# Patient Record
Sex: Male | Born: 1935 | Race: Black or African American | Hispanic: No | Smoking: Former smoker
Health system: Southern US, Community
[De-identification: ages and names within clinical notes are randomized; demographics above are authoritative.]

## PROBLEM LIST (undated history)

## (undated) DIAGNOSIS — D126 Benign neoplasm of colon, unspecified: Secondary | ICD-10-CM

## (undated) DIAGNOSIS — C882 Heavy chain disease: Secondary | ICD-10-CM

## (undated) DIAGNOSIS — D649 Anemia, unspecified: Secondary | ICD-10-CM

## (undated) DIAGNOSIS — D696 Thrombocytopenia, unspecified: Secondary | ICD-10-CM

## (undated) DIAGNOSIS — N183 Chronic kidney disease, stage 3 unspecified: Secondary | ICD-10-CM

## (undated) DIAGNOSIS — R911 Solitary pulmonary nodule: Secondary | ICD-10-CM

## (undated) DIAGNOSIS — C189 Malignant neoplasm of colon, unspecified: Secondary | ICD-10-CM

## (undated) DIAGNOSIS — D472 Monoclonal gammopathy: Secondary | ICD-10-CM

## (undated) DIAGNOSIS — E119 Type 2 diabetes mellitus without complications: Secondary | ICD-10-CM

## (undated) DIAGNOSIS — K3184 Gastroparesis: Secondary | ICD-10-CM

## (undated) DIAGNOSIS — N189 Chronic kidney disease, unspecified: Secondary | ICD-10-CM

## (undated) DIAGNOSIS — I495 Sick sinus syndrome: Secondary | ICD-10-CM

## (undated) DIAGNOSIS — I2510 Atherosclerotic heart disease of native coronary artery without angina pectoris: Secondary | ICD-10-CM

## (undated) DIAGNOSIS — C61 Malignant neoplasm of prostate: Secondary | ICD-10-CM

## (undated) DIAGNOSIS — N289 Disorder of kidney and ureter, unspecified: Secondary | ICD-10-CM

## (undated) DIAGNOSIS — I10 Essential (primary) hypertension: Secondary | ICD-10-CM

## (undated) DIAGNOSIS — E785 Hyperlipidemia, unspecified: Secondary | ICD-10-CM

## (undated) HISTORY — DX: Disorder of kidney and ureter, unspecified: N28.9

## (undated) HISTORY — DX: Solitary pulmonary nodule: R91.1

## (undated) HISTORY — DX: Benign neoplasm of colon, unspecified: D12.6

## (undated) HISTORY — DX: Gastroparesis: K31.84

## (undated) HISTORY — DX: Type 2 diabetes mellitus without complications: E11.9

## (undated) HISTORY — DX: Chronic kidney disease, stage 3 (moderate): N18.3

## (undated) HISTORY — DX: Sick sinus syndrome: I49.5

## (undated) HISTORY — DX: Atherosclerotic heart disease of native coronary artery without angina pectoris: I25.10

## (undated) HISTORY — DX: Monoclonal gammopathy: D47.2

## (undated) HISTORY — DX: Hyperlipidemia, unspecified: E78.5

## (undated) HISTORY — DX: Essential (primary) hypertension: I10

## (undated) HISTORY — PX: PROSTATE BIOPSY: SHX241

## (undated) HISTORY — DX: Thrombocytopenia, unspecified: D69.6

## (undated) HISTORY — DX: Heavy chain disease: C88.2

## (undated) HISTORY — PX: ESOPHAGOGASTRODUODENOSCOPY: SHX1529

## (undated) HISTORY — DX: Malignant neoplasm of colon, unspecified: C18.9

## (undated) HISTORY — DX: Chronic kidney disease, stage 3 unspecified: N18.30

## (undated) HISTORY — DX: Anemia, unspecified: D64.9

## (undated) HISTORY — DX: Chronic kidney disease, unspecified: N18.9

## (undated) HISTORY — DX: Malignant neoplasm of prostate: C61

---

## 1988-10-20 DIAGNOSIS — C189 Malignant neoplasm of colon, unspecified: Secondary | ICD-10-CM

## 1988-10-20 HISTORY — PX: HEMICOLECTOMY: SHX854

## 1988-10-20 HISTORY — DX: Malignant neoplasm of colon, unspecified: C18.9

## 2002-10-20 DIAGNOSIS — C61 Malignant neoplasm of prostate: Secondary | ICD-10-CM

## 2002-10-20 HISTORY — DX: Malignant neoplasm of prostate: C61

## 2003-12-08 ENCOUNTER — Ambulatory Visit (HOSPITAL_COMMUNITY): Admission: RE | Admit: 2003-12-08 | Discharge: 2003-12-08 | Payer: Self-pay | Admitting: *Deleted

## 2003-12-27 ENCOUNTER — Ambulatory Visit: Admission: RE | Admit: 2003-12-27 | Discharge: 2004-03-26 | Payer: Self-pay | Admitting: Radiation Oncology

## 2005-07-31 ENCOUNTER — Encounter (INDEPENDENT_AMBULATORY_CARE_PROVIDER_SITE_OTHER): Payer: Self-pay | Admitting: General Surgery

## 2005-07-31 ENCOUNTER — Ambulatory Visit (HOSPITAL_COMMUNITY): Admission: RE | Admit: 2005-07-31 | Discharge: 2005-07-31 | Payer: Self-pay | Admitting: General Surgery

## 2005-08-25 ENCOUNTER — Emergency Department (HOSPITAL_COMMUNITY): Admission: EM | Admit: 2005-08-25 | Discharge: 2005-08-25 | Payer: Self-pay | Admitting: Emergency Medicine

## 2006-07-03 ENCOUNTER — Ambulatory Visit: Payer: Self-pay | Admitting: Internal Medicine

## 2006-07-03 LAB — CONVERTED CEMR LAB: Hgb A1c MFr Bld: 6 %

## 2006-07-30 ENCOUNTER — Ambulatory Visit: Payer: Self-pay | Admitting: Internal Medicine

## 2006-07-30 LAB — CONVERTED CEMR LAB
Glucose, Bld: 237 mg/dL
Microalb Creat Ratio: 36.8 mg/g
Microalb, Ur: 1.35 mg/dL

## 2006-09-22 ENCOUNTER — Ambulatory Visit: Payer: Self-pay | Admitting: Internal Medicine

## 2006-09-22 LAB — CONVERTED CEMR LAB: Hgb A1c MFr Bld: 7.1 %

## 2006-10-21 ENCOUNTER — Ambulatory Visit: Payer: Self-pay | Admitting: Internal Medicine

## 2006-10-22 ENCOUNTER — Encounter (INDEPENDENT_AMBULATORY_CARE_PROVIDER_SITE_OTHER): Payer: Self-pay | Admitting: Internal Medicine

## 2006-10-22 LAB — CONVERTED CEMR LAB
ALT: <8 units/L (ref 0–53)
AST: 12 units/L (ref 0–37)
Albumin: 3.8 g/dL (ref 3.5–5.2)
Alkaline Phosphatase: 117 units/L (ref 39–117)
BUN: 11 mg/dL (ref 6–23)
CO2: 28 meq/L (ref 19–32)
Calcium: 9 mg/dL (ref 8.4–10.5)
Chloride: 105 meq/L (ref 96–112)
Cholesterol: 134 mg/dL (ref 0–200)
Creatinine, Ser: 1.08 mg/dL (ref 0.40–1.50)
Glucose, Bld: 127 mg/dL — ABNORMAL HIGH (ref 70–99)
HDL: 31 mg/dL — ABNORMAL LOW (ref >39)
LDL Cholesterol: 83 mg/dL (ref 0–99)
Potassium: 3.6 meq/L (ref 3.5–5.3)
Sodium: 144 meq/L (ref 135–145)
Total Bilirubin: 0.5 mg/dL (ref 0.3–1.2)
Total CHOL/HDL Ratio: 4.3
Total Protein: 7.5 g/dL (ref 6.0–8.3)
Triglycerides: 99 mg/dL (ref <150)
Uric Acid, Serum: 9 mg/dL — ABNORMAL HIGH (ref 2.4–7.0)
VLDL: 20 mg/dL (ref 0–40)

## 2006-11-19 ENCOUNTER — Ambulatory Visit: Payer: Self-pay | Admitting: Internal Medicine

## 2006-12-17 ENCOUNTER — Ambulatory Visit: Payer: Self-pay | Admitting: Internal Medicine

## 2006-12-17 DIAGNOSIS — M109 Gout, unspecified: Secondary | ICD-10-CM | POA: Insufficient documentation

## 2007-01-13 ENCOUNTER — Ambulatory Visit: Payer: Self-pay | Admitting: Internal Medicine

## 2007-01-13 LAB — CONVERTED CEMR LAB: Hemoglobin: 6.5 g/dL

## 2007-02-10 ENCOUNTER — Ambulatory Visit: Payer: Self-pay | Admitting: Internal Medicine

## 2007-02-10 LAB — CONVERTED CEMR LAB: Hgb A1c MFr Bld: 6.4 %

## 2007-02-15 ENCOUNTER — Ambulatory Visit: Payer: Self-pay | Admitting: Internal Medicine

## 2007-02-16 ENCOUNTER — Encounter (INDEPENDENT_AMBULATORY_CARE_PROVIDER_SITE_OTHER): Payer: Self-pay | Admitting: Internal Medicine

## 2007-04-19 ENCOUNTER — Ambulatory Visit (HOSPITAL_COMMUNITY): Admission: RE | Admit: 2007-04-19 | Discharge: 2007-04-19 | Payer: Self-pay | Admitting: Internal Medicine

## 2007-04-19 ENCOUNTER — Ambulatory Visit: Payer: Self-pay | Admitting: Internal Medicine

## 2007-04-19 HISTORY — PX: ESOPHAGOGASTRODUODENOSCOPY: SHX1529

## 2007-04-19 LAB — CONVERTED CEMR LAB
ALT: 16 units/L (ref 0–53)
AST: 21 units/L (ref 0–37)
Albumin: 3.7 g/dL (ref 3.5–5.2)
Alkaline Phosphatase: 89 units/L (ref 39–117)
BUN: 17 mg/dL (ref 6–23)
Basophils Absolute: 0 10*3/uL (ref 0.0–0.1)
Basophils Relative: 0 % (ref 0–1)
CO2: 27 meq/L (ref 19–32)
Calcium: 9.4 mg/dL (ref 8.4–10.5)
Chloride: 104 meq/L (ref 96–112)
Creatinine, Ser: 1.75 mg/dL — ABNORMAL HIGH (ref 0.40–1.50)
Eosinophils Absolute: 0.2 10*3/uL (ref 0.0–0.7)
Eosinophils Relative: 4 % (ref 0–5)
Ferritin: 312 ng/mL (ref 22–322)
Free T4: 1.26 ng/dL (ref 0.89–1.80)
Glucose, Bld: 109 mg/dL — ABNORMAL HIGH (ref 70–99)
HCT: 33.5 % — ABNORMAL LOW (ref 39.0–52.0)
Hemoglobin: 11.2 g/dL
Hemoglobin: 11 g/dL — ABNORMAL LOW (ref 13.0–17.0)
Iron: 80 ug/dL (ref 42–165)
Lymphocytes Relative: 20 % (ref 12–46)
Lymphs Abs: 1 10*3/uL (ref 0.7–3.3)
MCHC: 32.8 g/dL (ref 30.0–36.0)
MCV: 86.1 fL (ref 78.0–100.0)
Monocytes Absolute: 0.3 10*3/uL (ref 0.2–0.7)
Monocytes Relative: 6 % (ref 3–11)
Neutro Abs: 3.8 10*3/uL (ref 1.7–7.7)
Neutrophils Relative %: 71 % (ref 43–77)
Platelets: 165 10*3/uL (ref 150–400)
Potassium: 3.8 meq/L (ref 3.5–5.3)
RBC: 3.89 M/uL — ABNORMAL LOW (ref 4.22–5.81)
RDW: 14.2 % — ABNORMAL HIGH (ref 11.5–14.0)
Saturation Ratios: 30 % (ref 20–55)
Sodium: 141 meq/L (ref 135–145)
TIBC: 267 ug/dL (ref 215–435)
TSH: 0.957 microintl units/mL (ref 0.350–5.50)
Total Bilirubin: 0.9 mg/dL (ref 0.3–1.2)
Total Protein: 6.9 g/dL (ref 6.0–8.3)
UIBC: 187 ug/dL
Vitamin B-12: 258 pg/mL (ref 211–911)
WBC: 5.4 10*3/uL (ref 4.0–10.5)

## 2007-04-20 ENCOUNTER — Ambulatory Visit (HOSPITAL_COMMUNITY): Admission: RE | Admit: 2007-04-20 | Discharge: 2007-04-20 | Payer: Self-pay | Admitting: Gastroenterology

## 2007-04-20 ENCOUNTER — Ambulatory Visit: Payer: Self-pay | Admitting: Gastroenterology

## 2007-04-21 ENCOUNTER — Ambulatory Visit (HOSPITAL_COMMUNITY): Admission: RE | Admit: 2007-04-21 | Discharge: 2007-04-21 | Payer: Self-pay | Admitting: Gastroenterology

## 2007-04-30 ENCOUNTER — Ambulatory Visit (HOSPITAL_COMMUNITY): Admission: RE | Admit: 2007-04-30 | Discharge: 2007-04-30 | Payer: Self-pay | Admitting: Gastroenterology

## 2007-05-04 ENCOUNTER — Ambulatory Visit (HOSPITAL_COMMUNITY): Admission: RE | Admit: 2007-05-04 | Discharge: 2007-05-04 | Payer: Self-pay | Admitting: Gastroenterology

## 2007-05-04 ENCOUNTER — Encounter (INDEPENDENT_AMBULATORY_CARE_PROVIDER_SITE_OTHER): Payer: Self-pay | Admitting: Internal Medicine

## 2007-05-04 ENCOUNTER — Ambulatory Visit: Payer: Self-pay | Admitting: Gastroenterology

## 2007-05-04 ENCOUNTER — Encounter: Payer: Self-pay | Admitting: Gastroenterology

## 2007-05-04 HISTORY — PX: COLONOSCOPY W/ POLYPECTOMY: SHX1380

## 2007-05-06 ENCOUNTER — Encounter (HOSPITAL_COMMUNITY): Admission: RE | Admit: 2007-05-06 | Discharge: 2007-06-05 | Payer: Self-pay | Admitting: Gastroenterology

## 2007-05-21 ENCOUNTER — Telehealth (INDEPENDENT_AMBULATORY_CARE_PROVIDER_SITE_OTHER): Payer: Self-pay | Admitting: Internal Medicine

## 2007-05-25 ENCOUNTER — Telehealth (INDEPENDENT_AMBULATORY_CARE_PROVIDER_SITE_OTHER): Payer: Self-pay | Admitting: *Deleted

## 2007-05-26 ENCOUNTER — Ambulatory Visit: Payer: Self-pay | Admitting: Internal Medicine

## 2007-06-04 ENCOUNTER — Ambulatory Visit: Payer: Self-pay | Admitting: Gastroenterology

## 2007-06-04 ENCOUNTER — Encounter (INDEPENDENT_AMBULATORY_CARE_PROVIDER_SITE_OTHER): Payer: Self-pay | Admitting: Internal Medicine

## 2007-06-23 ENCOUNTER — Ambulatory Visit: Payer: Self-pay | Admitting: Internal Medicine

## 2007-06-23 LAB — CONVERTED CEMR LAB: Hgb A1c MFr Bld: 6.1 %

## 2007-06-24 ENCOUNTER — Encounter (INDEPENDENT_AMBULATORY_CARE_PROVIDER_SITE_OTHER): Payer: Self-pay | Admitting: Internal Medicine

## 2007-06-29 ENCOUNTER — Encounter (INDEPENDENT_AMBULATORY_CARE_PROVIDER_SITE_OTHER): Payer: Self-pay | Admitting: *Deleted

## 2007-06-29 LAB — CONVERTED CEMR LAB
ALT: 19 units/L (ref 0–53)
AST: 20 units/L (ref 0–37)
Albumin: 4.3 g/dL (ref 3.5–5.2)
Alkaline Phosphatase: 78 units/L (ref 39–117)
BUN: 24 mg/dL — ABNORMAL HIGH (ref 6–23)
Basophils Absolute: 0 10*3/uL (ref 0.0–0.1)
Basophils Relative: 1 % (ref 0–1)
CO2: 24 meq/L (ref 19–32)
Calcium: 9.3 mg/dL (ref 8.4–10.5)
Chloride: 104 meq/L (ref 96–112)
Creatinine, Ser: 2.01 mg/dL — ABNORMAL HIGH (ref 0.40–1.50)
Eosinophils Absolute: 0.1 10*3/uL (ref 0.0–0.7)
Eosinophils Relative: 2 % (ref 0–5)
Glucose, Bld: 119 mg/dL — ABNORMAL HIGH (ref 70–99)
HCT: 33.3 % — ABNORMAL LOW (ref 39.0–52.0)
Hemoglobin: 10.3 g/dL — ABNORMAL LOW (ref 13.0–17.0)
Lymphocytes Relative: 23 % (ref 12–46)
Lymphs Abs: 1.4 10*3/uL (ref 0.7–3.3)
MCHC: 30.9 g/dL (ref 30.0–36.0)
MCV: 89 fL (ref 78.0–100.0)
Monocytes Absolute: 0.3 10*3/uL (ref 0.2–0.7)
Monocytes Relative: 5 % (ref 3–11)
Neutro Abs: 4.1 10*3/uL (ref 1.7–7.7)
Neutrophils Relative %: 69 % (ref 43–77)
PSA: 0.75 ng/mL (ref 0.10–4.00)
Platelets: 202 10*3/uL (ref 150–400)
Potassium: 4.1 meq/L (ref 3.5–5.3)
Prealbumin: 29.4 mg/dL (ref 18.0–45.0)
RBC: 3.74 M/uL — ABNORMAL LOW (ref 4.22–5.81)
RDW: 16.5 % — ABNORMAL HIGH (ref 11.5–14.0)
Sodium: 142 meq/L (ref 135–145)
Total Bilirubin: 0.4 mg/dL (ref 0.3–1.2)
Total Protein: 7.2 g/dL (ref 6.0–8.3)
WBC: 5.9 10*3/uL (ref 4.0–10.5)

## 2007-07-09 ENCOUNTER — Ambulatory Visit: Payer: Self-pay | Admitting: Internal Medicine

## 2007-07-14 ENCOUNTER — Encounter (INDEPENDENT_AMBULATORY_CARE_PROVIDER_SITE_OTHER): Payer: Self-pay | Admitting: Internal Medicine

## 2007-07-15 ENCOUNTER — Telehealth (INDEPENDENT_AMBULATORY_CARE_PROVIDER_SITE_OTHER): Payer: Self-pay | Admitting: *Deleted

## 2007-07-15 LAB — CONVERTED CEMR LAB
Albumin: 3.7 g/dL (ref 3.5–5.2)
BUN: 17 mg/dL (ref 6–23)
CO2: 22 meq/L (ref 19–32)
Calcium: 9.2 mg/dL (ref 8.4–10.5)
Chloride: 107 meq/L (ref 96–112)
Creatinine, Ser: 1.74 mg/dL — ABNORMAL HIGH (ref 0.40–1.50)
Glucose, Bld: 96 mg/dL (ref 70–99)
Phosphorus: 3.4 mg/dL (ref 2.3–4.6)
Potassium: 4.4 meq/L (ref 3.5–5.3)
Sodium: 141 meq/L (ref 135–145)

## 2007-07-21 ENCOUNTER — Ambulatory Visit: Payer: Self-pay | Admitting: Gastroenterology

## 2007-07-21 ENCOUNTER — Encounter (INDEPENDENT_AMBULATORY_CARE_PROVIDER_SITE_OTHER): Payer: Self-pay | Admitting: Internal Medicine

## 2007-07-21 ENCOUNTER — Telehealth (INDEPENDENT_AMBULATORY_CARE_PROVIDER_SITE_OTHER): Payer: Self-pay | Admitting: Internal Medicine

## 2007-07-22 ENCOUNTER — Ambulatory Visit: Payer: Self-pay | Admitting: Internal Medicine

## 2007-07-26 ENCOUNTER — Encounter (INDEPENDENT_AMBULATORY_CARE_PROVIDER_SITE_OTHER): Payer: Self-pay | Admitting: Internal Medicine

## 2007-07-26 LAB — CONVERTED CEMR LAB
Albumin: 3.7 g/dL (ref 3.5–5.2)
BUN: 16 mg/dL (ref 6–23)
CO2: 25 meq/L (ref 19–32)
Calcium: 8.9 mg/dL (ref 8.4–10.5)
Chloride: 109 meq/L (ref 96–112)
Creatinine, Ser: 1.49 mg/dL (ref 0.40–1.50)
Glucose, Bld: 93 mg/dL (ref 70–99)
Phosphorus: 3.2 mg/dL (ref 2.3–4.6)
Potassium: 3.6 meq/L (ref 3.5–5.3)
Sodium: 144 meq/L (ref 135–145)

## 2007-08-26 ENCOUNTER — Ambulatory Visit: Payer: Self-pay | Admitting: Internal Medicine

## 2007-08-27 ENCOUNTER — Telehealth (INDEPENDENT_AMBULATORY_CARE_PROVIDER_SITE_OTHER): Payer: Self-pay | Admitting: *Deleted

## 2007-08-27 LAB — CONVERTED CEMR LAB
Albumin: 3.9 g/dL (ref 3.5–5.2)
BUN: 20 mg/dL (ref 6–23)
CO2: 26 meq/L (ref 19–32)
Calcium: 9.1 mg/dL (ref 8.4–10.5)
Chloride: 105 meq/L (ref 96–112)
Creatinine, Ser: 2.07 mg/dL — ABNORMAL HIGH (ref 0.40–1.50)
Crystals, Fluid: NONE SEEN
Glucose, Bld: 156 mg/dL — ABNORMAL HIGH (ref 70–99)
Phosphorus: 3.5 mg/dL (ref 2.3–4.6)
Potassium: 3.1 meq/L — ABNORMAL LOW (ref 3.5–5.3)
Sodium: 145 meq/L (ref 135–145)

## 2007-09-01 ENCOUNTER — Encounter (INDEPENDENT_AMBULATORY_CARE_PROVIDER_SITE_OTHER): Payer: Self-pay | Admitting: Internal Medicine

## 2007-09-10 ENCOUNTER — Ambulatory Visit: Payer: Self-pay | Admitting: Internal Medicine

## 2007-09-12 LAB — CONVERTED CEMR LAB
ALT: 8 units/L (ref 0–53)
AST: 13 units/L (ref 0–37)
Albumin: 3.5 g/dL (ref 3.5–5.2)
Alkaline Phosphatase: 75 units/L (ref 39–117)
BUN: 14 mg/dL (ref 6–23)
Basophils Absolute: 0 10*3/uL (ref 0.0–0.1)
Basophils Relative: 1 % (ref 0–1)
CO2: 25 meq/L (ref 19–32)
Calcium: 9.1 mg/dL (ref 8.4–10.5)
Chloride: 109 meq/L (ref 96–112)
Cholesterol: 179 mg/dL (ref 0–200)
Creatinine, Ser: 1.5 mg/dL (ref 0.40–1.50)
Eosinophils Absolute: 0.2 10*3/uL (ref 0.2–0.7)
Eosinophils Relative: 4 % (ref 0–5)
Glucose, Bld: 89 mg/dL (ref 70–99)
HCT: 27.4 % — ABNORMAL LOW (ref 39.0–52.0)
HDL: 41 mg/dL (ref >39)
Hemoglobin: 8.5 g/dL — ABNORMAL LOW (ref 13.0–17.0)
LDL Cholesterol: 127 mg/dL — ABNORMAL HIGH (ref 0–99)
Lymphocytes Relative: 19 % (ref 12–46)
Lymphs Abs: 1 10*3/uL (ref 0.7–4.0)
MCHC: 31 g/dL (ref 30.0–36.0)
MCV: 87.8 fL (ref 78.0–100.0)
Monocytes Absolute: 0.3 10*3/uL (ref 0.1–1.0)
Monocytes Relative: 5 % (ref 3–12)
Neutro Abs: 3.8 10*3/uL (ref 1.7–7.7)
Neutrophils Relative %: 71 % (ref 43–77)
Platelets: 187 10*3/uL (ref 150–400)
Potassium: 4 meq/L (ref 3.5–5.3)
RBC: 3.12 M/uL — ABNORMAL LOW (ref 4.22–5.81)
RDW: 15.2 % (ref 11.5–15.5)
Sodium: 144 meq/L (ref 135–145)
Total Bilirubin: 0.5 mg/dL (ref 0.3–1.2)
Total CHOL/HDL Ratio: 4.4
Total Protein: 6.6 g/dL (ref 6.0–8.3)
Triglycerides: 54 mg/dL (ref <150)
Uric Acid, Serum: 7.3 mg/dL — ABNORMAL HIGH (ref 2.4–7.0)
VLDL: 11 mg/dL (ref 0–40)
WBC: 5.3 10*3/uL (ref 4.0–10.5)

## 2007-09-13 ENCOUNTER — Telehealth (INDEPENDENT_AMBULATORY_CARE_PROVIDER_SITE_OTHER): Payer: Self-pay | Admitting: *Deleted

## 2007-10-21 DIAGNOSIS — R911 Solitary pulmonary nodule: Secondary | ICD-10-CM

## 2007-10-21 HISTORY — DX: Solitary pulmonary nodule: R91.1

## 2007-11-11 ENCOUNTER — Ambulatory Visit: Payer: Self-pay | Admitting: Internal Medicine

## 2007-11-23 ENCOUNTER — Encounter (INDEPENDENT_AMBULATORY_CARE_PROVIDER_SITE_OTHER): Payer: Self-pay | Admitting: Internal Medicine

## 2007-11-25 ENCOUNTER — Ambulatory Visit (HOSPITAL_COMMUNITY): Admission: RE | Admit: 2007-11-25 | Discharge: 2007-11-25 | Payer: Self-pay | Admitting: Internal Medicine

## 2007-11-25 ENCOUNTER — Ambulatory Visit: Payer: Self-pay | Admitting: Internal Medicine

## 2007-11-25 DIAGNOSIS — D696 Thrombocytopenia, unspecified: Secondary | ICD-10-CM

## 2007-11-25 LAB — CONVERTED CEMR LAB
ALT: 17 units/L (ref 0–53)
AST: 21 units/L (ref 0–37)
Albumin: 3 g/dL — ABNORMAL LOW (ref 3.5–5.2)
Alkaline Phosphatase: 80 units/L (ref 39–117)
BUN: 15 mg/dL (ref 6–23)
Basophils Absolute: 0 10*3/uL (ref 0.0–0.1)
Basophils Relative: 0 % (ref 0–1)
Blood Glucose, Fingerstick: 125
CO2: 27 meq/L (ref 19–32)
Calcium: 9 mg/dL (ref 8.4–10.5)
Chloride: 108 meq/L (ref 96–112)
Creatinine, Ser: 1.63 mg/dL — ABNORMAL HIGH (ref 0.40–1.50)
Eosinophils Absolute: 0.3 10*3/uL (ref 0.0–0.7)
Eosinophils Relative: 6 % — ABNORMAL HIGH (ref 0–5)
Glucose, Bld: 109 mg/dL — ABNORMAL HIGH (ref 70–99)
HCT: 26.7 % — ABNORMAL LOW (ref 39.0–52.0)
Hemoglobin: 9 g/dL — ABNORMAL LOW (ref 13.0–17.0)
Hgb A1c MFr Bld: 6 %
Lymphocytes Relative: 14 % (ref 12–46)
Lymphs Abs: 0.7 10*3/uL (ref 0.7–4.0)
MCHC: 33.7 g/dL (ref 30.0–36.0)
MCV: 84.2 fL (ref 78.0–100.0)
Monocytes Absolute: 0.3 10*3/uL (ref 0.1–1.0)
Monocytes Relative: 6 % (ref 3–12)
Neutro Abs: 3.7 10*3/uL (ref 1.7–7.7)
Neutrophils Relative %: 72 % (ref 43–77)
Platelets: 107 10*3/uL — ABNORMAL LOW (ref 150–400)
Potassium: 3.9 meq/L (ref 3.5–5.3)
Pro B Natriuretic peptide (BNP): 764 pg/mL — ABNORMAL HIGH (ref 0.0–100.0)
RBC: 3.17 M/uL — ABNORMAL LOW (ref 4.22–5.81)
RDW: 17.1 % — ABNORMAL HIGH (ref 11.5–15.5)
Sodium: 142 meq/L (ref 135–145)
Total Bilirubin: 0.8 mg/dL (ref 0.3–1.2)
Total Protein: 5.8 g/dL — ABNORMAL LOW (ref 6.0–8.3)
WBC: 5.2 10*3/uL (ref 4.0–10.5)

## 2007-11-29 ENCOUNTER — Ambulatory Visit (HOSPITAL_COMMUNITY): Admission: RE | Admit: 2007-11-29 | Discharge: 2007-11-29 | Payer: Self-pay | Admitting: Internal Medicine

## 2007-11-29 ENCOUNTER — Ambulatory Visit: Payer: Self-pay | Admitting: Internal Medicine

## 2007-11-29 ENCOUNTER — Telehealth (INDEPENDENT_AMBULATORY_CARE_PROVIDER_SITE_OTHER): Payer: Self-pay | Admitting: *Deleted

## 2007-12-07 ENCOUNTER — Encounter (INDEPENDENT_AMBULATORY_CARE_PROVIDER_SITE_OTHER): Payer: Self-pay | Admitting: Internal Medicine

## 2007-12-07 ENCOUNTER — Encounter (HOSPITAL_COMMUNITY): Admission: RE | Admit: 2007-12-07 | Discharge: 2008-01-06 | Payer: Self-pay | Admitting: Oncology

## 2007-12-07 ENCOUNTER — Ambulatory Visit (HOSPITAL_COMMUNITY): Payer: Self-pay | Admitting: Oncology

## 2007-12-21 ENCOUNTER — Encounter (INDEPENDENT_AMBULATORY_CARE_PROVIDER_SITE_OTHER): Payer: Self-pay | Admitting: Internal Medicine

## 2007-12-21 ENCOUNTER — Telehealth (INDEPENDENT_AMBULATORY_CARE_PROVIDER_SITE_OTHER): Payer: Self-pay | Admitting: Internal Medicine

## 2007-12-22 ENCOUNTER — Telehealth (INDEPENDENT_AMBULATORY_CARE_PROVIDER_SITE_OTHER): Payer: Self-pay | Admitting: *Deleted

## 2007-12-22 ENCOUNTER — Ambulatory Visit: Payer: Self-pay | Admitting: Internal Medicine

## 2007-12-23 ENCOUNTER — Ambulatory Visit: Payer: Self-pay | Admitting: Cardiology

## 2007-12-27 ENCOUNTER — Encounter (INDEPENDENT_AMBULATORY_CARE_PROVIDER_SITE_OTHER): Payer: Self-pay | Admitting: Internal Medicine

## 2007-12-27 LAB — CONVERTED CEMR LAB
ALT: 10 units/L
AST: 20 units/L
Albumin: 3.4 g/dL
Alkaline Phosphatase: 81 units/L
BUN: 16 mg/dL
CO2: 22 meq/L
Calcium: 8.5 mg/dL
Chloride: 110 meq/L
Creatinine, Ser: 1.66 mg/dL
Glucose, Bld: 89 mg/dL
Potassium: 4.3 meq/L
Sodium: 146 meq/L
Total Bilirubin: 0.6 mg/dL
Total Protein: 6.2 g/dL

## 2007-12-29 ENCOUNTER — Ambulatory Visit: Payer: Self-pay | Admitting: Internal Medicine

## 2007-12-29 LAB — CONVERTED CEMR LAB
Basophils Absolute: 0 10*3/uL
Basophils Relative: 0 %
Eosinophils Absolute: 0.3 10*3/uL
Eosinophils Relative: 8 %
HCT: 27.8 %
HCT: 25.3 % — ABNORMAL LOW (ref 39.0–52.0)
Hemoglobin: 8.6 g/dL
Hemoglobin: 6.9 g/dL
Hemoglobin: 8.5 g/dL — ABNORMAL LOW (ref 13.0–17.0)
Lymphocytes Relative: 19 %
Lymphs Abs: 0.8 10*3/uL
MCHC: 30.9 g/dL
MCV: 85.8 fL
Monocytes Absolute: 0.3 10*3/uL
Monocytes Relative: 7 %
Neutro Abs: 3 10*3/uL
Neutrophils Relative %: 67 %
Platelets: 133 10*3/uL
RBC: 3.24 M/uL
RDW: 17 %
WBC: 4.5 10*3/uL

## 2007-12-31 ENCOUNTER — Ambulatory Visit: Payer: Self-pay | Admitting: Cardiology

## 2007-12-31 ENCOUNTER — Encounter (INDEPENDENT_AMBULATORY_CARE_PROVIDER_SITE_OTHER): Payer: Self-pay | Admitting: Internal Medicine

## 2008-01-12 ENCOUNTER — Ambulatory Visit: Payer: Self-pay | Admitting: Internal Medicine

## 2008-01-12 LAB — CONVERTED CEMR LAB: Hemoglobin: 9.6 g/dL

## 2008-01-13 ENCOUNTER — Encounter (INDEPENDENT_AMBULATORY_CARE_PROVIDER_SITE_OTHER): Payer: Self-pay | Admitting: Internal Medicine

## 2008-01-13 ENCOUNTER — Ambulatory Visit: Payer: Self-pay | Admitting: Cardiology

## 2008-01-19 ENCOUNTER — Inpatient Hospital Stay (HOSPITAL_BASED_OUTPATIENT_CLINIC_OR_DEPARTMENT_OTHER): Admission: RE | Admit: 2008-01-19 | Discharge: 2008-01-19 | Payer: Self-pay | Admitting: Cardiology

## 2008-01-19 ENCOUNTER — Ambulatory Visit: Payer: Self-pay | Admitting: Cardiology

## 2008-01-24 ENCOUNTER — Encounter (INDEPENDENT_AMBULATORY_CARE_PROVIDER_SITE_OTHER): Payer: Self-pay | Admitting: Internal Medicine

## 2008-01-24 ENCOUNTER — Ambulatory Visit: Payer: Self-pay | Admitting: Cardiology

## 2008-01-26 ENCOUNTER — Ambulatory Visit: Payer: Self-pay | Admitting: Internal Medicine

## 2008-01-26 ENCOUNTER — Ambulatory Visit (HOSPITAL_COMMUNITY): Admission: RE | Admit: 2008-01-26 | Discharge: 2008-01-26 | Payer: Self-pay | Admitting: Cardiology

## 2008-01-26 LAB — CONVERTED CEMR LAB: Hemoglobin: 9.7 g/dL

## 2008-01-27 ENCOUNTER — Ambulatory Visit: Payer: Self-pay | Admitting: Cardiology

## 2008-02-09 ENCOUNTER — Ambulatory Visit: Payer: Self-pay | Admitting: Internal Medicine

## 2008-02-09 LAB — CONVERTED CEMR LAB: Hemoglobin: 8.1 g/dL

## 2008-02-23 ENCOUNTER — Ambulatory Visit: Payer: Self-pay | Admitting: Internal Medicine

## 2008-02-23 LAB — CONVERTED CEMR LAB
Blood Glucose, Fingerstick: 120
Hemoglobin: 11.8 g/dL
Hgb A1c MFr Bld: 5.4 %

## 2008-02-24 ENCOUNTER — Telehealth (INDEPENDENT_AMBULATORY_CARE_PROVIDER_SITE_OTHER): Payer: Self-pay | Admitting: *Deleted

## 2008-02-24 LAB — CONVERTED CEMR LAB
ALT: 8 units/L (ref 0–53)
Albumin: 3.9 g/dL (ref 3.5–5.2)
CO2: 24 meq/L (ref 19–32)
Calcium: 9 mg/dL (ref 8.4–10.5)
Chloride: 108 meq/L (ref 96–112)
Cholesterol: 139 mg/dL (ref 0–200)
Creatinine, Ser: 1.44 mg/dL (ref 0.40–1.50)
Potassium: 3.9 meq/L (ref 3.5–5.3)
Total Protein: 7.1 g/dL (ref 6.0–8.3)

## 2008-02-25 ENCOUNTER — Encounter (INDEPENDENT_AMBULATORY_CARE_PROVIDER_SITE_OTHER): Payer: Self-pay | Admitting: Internal Medicine

## 2008-03-22 ENCOUNTER — Ambulatory Visit: Payer: Self-pay | Admitting: Internal Medicine

## 2008-04-19 ENCOUNTER — Ambulatory Visit: Payer: Self-pay | Admitting: Internal Medicine

## 2008-04-19 LAB — CONVERTED CEMR LAB: Hemoglobin: 9.7 g/dL

## 2008-05-04 ENCOUNTER — Ambulatory Visit: Payer: Self-pay | Admitting: Cardiology

## 2008-05-04 ENCOUNTER — Encounter (INDEPENDENT_AMBULATORY_CARE_PROVIDER_SITE_OTHER): Payer: Self-pay | Admitting: Internal Medicine

## 2008-05-08 ENCOUNTER — Encounter: Payer: Self-pay | Admitting: Cardiology

## 2008-05-08 ENCOUNTER — Ambulatory Visit (HOSPITAL_COMMUNITY): Admission: RE | Admit: 2008-05-08 | Discharge: 2008-05-08 | Payer: Self-pay | Admitting: Cardiology

## 2008-05-08 ENCOUNTER — Ambulatory Visit: Payer: Self-pay | Admitting: Cardiology

## 2008-05-19 LAB — CONVERTED CEMR LAB: OCCULT 1: NEGATIVE

## 2008-05-25 ENCOUNTER — Ambulatory Visit: Payer: Self-pay | Admitting: Internal Medicine

## 2008-05-25 LAB — CONVERTED CEMR LAB
Blood Glucose, Fingerstick: 113
Hemoglobin: 11.2 g/dL
Hgb A1c MFr Bld: 6.1 %

## 2008-06-22 ENCOUNTER — Ambulatory Visit: Payer: Self-pay | Admitting: Internal Medicine

## 2008-06-22 LAB — CONVERTED CEMR LAB: Hemoglobin: 11.4 g/dL

## 2008-06-28 ENCOUNTER — Encounter (INDEPENDENT_AMBULATORY_CARE_PROVIDER_SITE_OTHER): Payer: Self-pay | Admitting: Internal Medicine

## 2008-06-28 LAB — CONVERTED CEMR LAB
Albumin: 4 g/dL (ref 3.5–5.2)
Alkaline Phosphatase: 97 units/L (ref 39–117)
Chloride: 105 meq/L (ref 96–112)
Eosinophils Absolute: 0.2 10*3/uL (ref 0.0–0.7)
Glucose, Bld: 85 mg/dL (ref 70–99)
Lymphocytes Relative: 21 % (ref 12–46)
Lymphs Abs: 1 10*3/uL (ref 0.7–4.0)
MCV: 85.2 fL (ref 78.0–100.0)
Neutro Abs: 3.3 10*3/uL (ref 1.7–7.7)
Neutrophils Relative %: 69 % (ref 43–77)
Platelets: 150 10*3/uL (ref 150–400)
Potassium: 4 meq/L (ref 3.5–5.3)
Saturation Ratios: 39 % (ref 20–55)
Sodium: 143 meq/L (ref 135–145)
Total Protein: 7.4 g/dL (ref 6.0–8.3)
WBC: 4.7 10*3/uL (ref 4.0–10.5)

## 2008-07-13 ENCOUNTER — Ambulatory Visit: Payer: Self-pay | Admitting: Gastroenterology

## 2008-07-13 ENCOUNTER — Encounter (INDEPENDENT_AMBULATORY_CARE_PROVIDER_SITE_OTHER): Payer: Self-pay | Admitting: Internal Medicine

## 2008-07-20 ENCOUNTER — Ambulatory Visit: Payer: Self-pay | Admitting: Internal Medicine

## 2008-07-20 LAB — CONVERTED CEMR LAB: Hemoglobin: 9 g/dL

## 2008-08-07 ENCOUNTER — Ambulatory Visit: Payer: Self-pay | Admitting: Gastroenterology

## 2008-08-07 ENCOUNTER — Encounter (INDEPENDENT_AMBULATORY_CARE_PROVIDER_SITE_OTHER): Payer: Self-pay | Admitting: Internal Medicine

## 2008-08-14 ENCOUNTER — Encounter (INDEPENDENT_AMBULATORY_CARE_PROVIDER_SITE_OTHER): Payer: Self-pay | Admitting: Internal Medicine

## 2008-08-21 ENCOUNTER — Ambulatory Visit: Payer: Self-pay | Admitting: Internal Medicine

## 2008-09-18 ENCOUNTER — Ambulatory Visit: Payer: Self-pay | Admitting: Internal Medicine

## 2008-09-18 LAB — CONVERTED CEMR LAB: Hgb A1c MFr Bld: 5.8 %

## 2008-09-19 LAB — CONVERTED CEMR LAB
ALT: 11 units/L (ref 0–53)
Alkaline Phosphatase: 99 units/L (ref 39–117)
CO2: 24 meq/L (ref 19–32)
LDL Cholesterol: 86 mg/dL (ref 0–99)
Microalb Creat Ratio: 14.9 mg/g (ref 0.0–30.0)
Microalb, Ur: 1.69 mg/dL (ref 0.00–1.89)
Sodium: 145 meq/L (ref 135–145)
Total Bilirubin: 0.6 mg/dL (ref 0.3–1.2)
Total Protein: 7 g/dL (ref 6.0–8.3)
VLDL: 8 mg/dL (ref 0–40)

## 2008-10-10 ENCOUNTER — Ambulatory Visit: Payer: Self-pay | Admitting: Internal Medicine

## 2008-11-07 ENCOUNTER — Ambulatory Visit: Payer: Self-pay | Admitting: Internal Medicine

## 2008-12-05 ENCOUNTER — Ambulatory Visit: Payer: Self-pay | Admitting: Internal Medicine

## 2008-12-05 LAB — CONVERTED CEMR LAB: Hgb A1c MFr Bld: 5.8 %

## 2008-12-12 ENCOUNTER — Encounter (INDEPENDENT_AMBULATORY_CARE_PROVIDER_SITE_OTHER): Payer: Self-pay | Admitting: *Deleted

## 2008-12-12 LAB — CONVERTED CEMR LAB
Albumin: 4 g/dL (ref 3.5–5.2)
Basophils Absolute: 0 10*3/uL (ref 0.0–0.1)
CO2: 24 meq/L (ref 19–32)
Chloride: 106 meq/L (ref 96–112)
Eosinophils Relative: 5 % (ref 0–5)
Ferritin: 232 ng/mL (ref 22–322)
Iron: 55 ug/dL (ref 42–165)
Lymphocytes Relative: 22 % (ref 12–46)
Neutro Abs: 3.3 10*3/uL (ref 1.7–7.7)
Phosphorus: 4.6 mg/dL (ref 2.3–4.6)
Platelets: 113 10*3/uL — ABNORMAL LOW (ref 150–400)
RDW: 15.4 % (ref 11.5–15.5)
Saturation Ratios: 24 % (ref 20–55)
Sodium: 146 meq/L — ABNORMAL HIGH (ref 135–145)
TIBC: 226 ug/dL (ref 215–435)

## 2009-01-08 ENCOUNTER — Encounter (INDEPENDENT_AMBULATORY_CARE_PROVIDER_SITE_OTHER): Payer: Self-pay | Admitting: *Deleted

## 2009-01-09 ENCOUNTER — Ambulatory Visit: Payer: Self-pay | Admitting: Internal Medicine

## 2009-01-09 LAB — CONVERTED CEMR LAB: Hemoglobin: 10.3 g/dL

## 2009-01-17 DIAGNOSIS — K3184 Gastroparesis: Secondary | ICD-10-CM | POA: Insufficient documentation

## 2009-01-24 ENCOUNTER — Encounter (INDEPENDENT_AMBULATORY_CARE_PROVIDER_SITE_OTHER): Payer: Self-pay | Admitting: *Deleted

## 2009-01-24 ENCOUNTER — Ambulatory Visit: Payer: Self-pay | Admitting: Gastroenterology

## 2009-01-24 ENCOUNTER — Ambulatory Visit: Payer: Self-pay | Admitting: Internal Medicine

## 2009-01-24 DIAGNOSIS — I498 Other specified cardiac arrhythmias: Secondary | ICD-10-CM | POA: Insufficient documentation

## 2009-01-24 LAB — CONVERTED CEMR LAB
ALT: 13 units/L
AST: 19 units/L
Alkaline Phosphatase: 77 units/L
BUN: 38 mg/dL
BUN: 38 mg/dL — ABNORMAL HIGH (ref 6–23)
Basophils Absolute: 0 10*3/uL (ref 0.0–0.1)
Basophils Relative: 0 %
Calcium: 9.4 mg/dL
Calcium: 9.4 mg/dL (ref 8.4–10.5)
Creatinine, Ser: 2.3 mg/dL — ABNORMAL HIGH (ref 0.40–1.50)
Eosinophils Absolute: 0.1 10*3/uL
Eosinophils Relative: 2 %
HCT: 33 %
Hemoglobin: 10.9 g/dL
Hemoglobin: 10.9 g/dL — ABNORMAL LOW (ref 13.0–17.0)
Lymphocytes Relative: 21 % (ref 12–46)
MCHC: 33.2 g/dL
Monocytes Absolute: 0.3 10*3/uL (ref 0.1–1.0)
Monocytes Relative: 6 %
Neutro Abs: 2.8 10*3/uL (ref 1.7–7.7)
Neutrophils Relative %: 68 % (ref 43–77)
Platelets: 108 10*3/uL — ABNORMAL LOW (ref 150–400)
Potassium: 4.1 meq/L
Potassium: 4.1 meq/L (ref 3.5–5.3)
RDW: 15.4 %
RDW: 15.4 % (ref 11.5–15.5)
Sodium: 143 meq/L (ref 135–145)
Troponin I: <0.01 ng/mL (ref <0.06)

## 2009-01-25 ENCOUNTER — Ambulatory Visit: Payer: Self-pay | Admitting: Physician Assistant

## 2009-01-25 ENCOUNTER — Encounter: Payer: Self-pay | Admitting: Gastroenterology

## 2009-01-25 ENCOUNTER — Encounter (INDEPENDENT_AMBULATORY_CARE_PROVIDER_SITE_OTHER): Payer: Self-pay | Admitting: *Deleted

## 2009-01-26 ENCOUNTER — Telehealth (INDEPENDENT_AMBULATORY_CARE_PROVIDER_SITE_OTHER): Payer: Self-pay | Admitting: Internal Medicine

## 2009-01-30 ENCOUNTER — Ambulatory Visit (HOSPITAL_COMMUNITY): Admission: RE | Admit: 2009-01-30 | Discharge: 2009-01-30 | Payer: Self-pay | Admitting: Cardiology

## 2009-02-01 ENCOUNTER — Encounter (INDEPENDENT_AMBULATORY_CARE_PROVIDER_SITE_OTHER): Payer: Self-pay | Admitting: Internal Medicine

## 2009-02-01 ENCOUNTER — Ambulatory Visit: Payer: Self-pay | Admitting: Cardiology

## 2009-02-02 ENCOUNTER — Ambulatory Visit: Payer: Self-pay | Admitting: Cardiology

## 2009-02-05 ENCOUNTER — Encounter (INDEPENDENT_AMBULATORY_CARE_PROVIDER_SITE_OTHER): Payer: Self-pay | Admitting: Internal Medicine

## 2009-02-05 ENCOUNTER — Ambulatory Visit: Payer: Self-pay | Admitting: Cardiology

## 2009-02-06 ENCOUNTER — Ambulatory Visit: Payer: Self-pay | Admitting: Internal Medicine

## 2009-02-06 LAB — CONVERTED CEMR LAB: Hemoglobin: 9.8 g/dL

## 2009-02-08 ENCOUNTER — Telehealth (INDEPENDENT_AMBULATORY_CARE_PROVIDER_SITE_OTHER): Payer: Self-pay | Admitting: *Deleted

## 2009-02-12 ENCOUNTER — Encounter (INDEPENDENT_AMBULATORY_CARE_PROVIDER_SITE_OTHER): Payer: Self-pay | Admitting: Internal Medicine

## 2009-02-15 ENCOUNTER — Ambulatory Visit: Payer: Self-pay | Admitting: Internal Medicine

## 2009-03-06 ENCOUNTER — Ambulatory Visit: Payer: Self-pay | Admitting: Internal Medicine

## 2009-03-06 DIAGNOSIS — H269 Unspecified cataract: Secondary | ICD-10-CM

## 2009-03-06 LAB — CONVERTED CEMR LAB
Blood Glucose, Fingerstick: 110
Hemoglobin: 9.4 g/dL

## 2009-03-07 ENCOUNTER — Encounter (INDEPENDENT_AMBULATORY_CARE_PROVIDER_SITE_OTHER): Payer: Self-pay | Admitting: Internal Medicine

## 2009-03-07 LAB — CONVERTED CEMR LAB
AST: 15 units/L (ref 0–37)
Alkaline Phosphatase: 85 units/L (ref 39–117)
BUN: 24 mg/dL — ABNORMAL HIGH (ref 6–23)
Calcium: 9.3 mg/dL (ref 8.4–10.5)
Chloride: 107 meq/L (ref 96–112)
Creatinine, Ser: 1.69 mg/dL — ABNORMAL HIGH (ref 0.40–1.50)
HDL: 41 mg/dL (ref >39)
Total Bilirubin: 0.7 mg/dL (ref 0.3–1.2)
Total CHOL/HDL Ratio: 3.2

## 2009-04-03 ENCOUNTER — Ambulatory Visit: Payer: Self-pay | Admitting: Internal Medicine

## 2009-04-03 LAB — CONVERTED CEMR LAB: Hemoglobin: 9.5 g/dL

## 2009-05-01 ENCOUNTER — Ambulatory Visit: Payer: Self-pay | Admitting: Internal Medicine

## 2009-05-01 ENCOUNTER — Encounter: Payer: Self-pay | Admitting: Cardiology

## 2009-05-01 LAB — CONVERTED CEMR LAB
AST: 15 units/L (ref 0–37)
Alkaline Phosphatase: 90 units/L (ref 39–117)
BUN: 23 mg/dL (ref 6–23)
CO2: 23 meq/L (ref 19–32)
Calcium: 9 mg/dL (ref 8.4–10.5)
Chloride: 110 meq/L (ref 96–112)
Creatinine, Ser: 1.44 mg/dL (ref 0.40–1.50)

## 2009-05-02 ENCOUNTER — Telehealth (INDEPENDENT_AMBULATORY_CARE_PROVIDER_SITE_OTHER): Payer: Self-pay

## 2009-05-25 ENCOUNTER — Telehealth: Payer: Self-pay | Admitting: Cardiology

## 2009-06-04 ENCOUNTER — Ambulatory Visit: Payer: Self-pay | Admitting: Internal Medicine

## 2009-06-04 LAB — CONVERTED CEMR LAB: Hemoglobin: 9.5 g/dL

## 2009-07-02 ENCOUNTER — Ambulatory Visit: Payer: Self-pay | Admitting: Internal Medicine

## 2009-07-02 LAB — CONVERTED CEMR LAB: Hemoglobin: 10.5 g/dL

## 2009-07-03 ENCOUNTER — Encounter (INDEPENDENT_AMBULATORY_CARE_PROVIDER_SITE_OTHER): Payer: Self-pay | Admitting: Internal Medicine

## 2009-07-05 LAB — CONVERTED CEMR LAB
Chloride: 109 meq/L (ref 96–112)
Iron: 94 ug/dL (ref 42–165)
Lymphocytes Relative: 26 % (ref 12–46)
Lymphs Abs: 1 10*3/uL (ref 0.7–4.0)
Neutro Abs: 2.4 10*3/uL (ref 1.7–7.7)
Neutrophils Relative %: 63 % (ref 43–77)
Phosphorus: 2.9 mg/dL (ref 2.3–4.6)
Platelets: 134 10*3/uL — ABNORMAL LOW (ref 150–400)
Potassium: 3.9 meq/L (ref 3.5–5.3)
WBC: 3.9 10*3/uL — ABNORMAL LOW (ref 4.0–10.5)

## 2009-07-19 ENCOUNTER — Encounter (INDEPENDENT_AMBULATORY_CARE_PROVIDER_SITE_OTHER): Payer: Self-pay | Admitting: Internal Medicine

## 2009-07-23 ENCOUNTER — Encounter (INDEPENDENT_AMBULATORY_CARE_PROVIDER_SITE_OTHER): Payer: Self-pay | Admitting: *Deleted

## 2009-08-09 ENCOUNTER — Encounter (INDEPENDENT_AMBULATORY_CARE_PROVIDER_SITE_OTHER): Payer: Self-pay | Admitting: *Deleted

## 2009-08-09 ENCOUNTER — Ambulatory Visit: Payer: Self-pay | Admitting: Cardiology

## 2009-08-22 ENCOUNTER — Encounter (INDEPENDENT_AMBULATORY_CARE_PROVIDER_SITE_OTHER): Payer: Self-pay | Admitting: *Deleted

## 2009-08-22 LAB — CONVERTED CEMR LAB: OCCULT 3: NEGATIVE

## 2009-08-24 ENCOUNTER — Encounter (INDEPENDENT_AMBULATORY_CARE_PROVIDER_SITE_OTHER): Payer: Self-pay | Admitting: *Deleted

## 2009-08-24 LAB — CONVERTED CEMR LAB
OCCULT 1: NEGATIVE
OCCULT 2: NEGATIVE

## 2009-12-12 ENCOUNTER — Ambulatory Visit: Payer: Self-pay | Admitting: Gastroenterology

## 2010-05-02 LAB — CONVERTED CEMR LAB
Albumin: 4 g/dL
BUN: 26 mg/dL
CO2: 25 meq/L
Calcium: 9.2 mg/dL
Chloride: 112 meq/L
Cholesterol: 187 mg/dL
Creatinine, Ser: 1.54 mg/dL
GFR calc non Af Amer: 43 mL/min
Glucose, Bld: 81 mg/dL
HCT: 31.6 %
Hemoglobin: 10.4 g/dL
LDL Cholesterol: 121 mg/dL
MCV: 89.4 fL
Platelets: 133 10*3/uL
Total Protein: 7 g/dL

## 2010-05-27 ENCOUNTER — Ambulatory Visit: Payer: Self-pay | Admitting: Gastroenterology

## 2010-05-27 DIAGNOSIS — Z860100 Personal history of colon polyps, unspecified: Secondary | ICD-10-CM | POA: Insufficient documentation

## 2010-05-28 ENCOUNTER — Ambulatory Visit: Payer: Self-pay | Admitting: Gastroenterology

## 2010-06-09 ENCOUNTER — Encounter: Payer: Self-pay | Admitting: Gastroenterology

## 2010-07-17 ENCOUNTER — Ambulatory Visit: Payer: Self-pay | Admitting: Gastroenterology

## 2010-07-17 DIAGNOSIS — K921 Melena: Secondary | ICD-10-CM

## 2010-07-18 ENCOUNTER — Encounter: Payer: Self-pay | Admitting: Gastroenterology

## 2010-07-23 ENCOUNTER — Ambulatory Visit: Payer: Self-pay | Admitting: Gastroenterology

## 2010-07-23 ENCOUNTER — Ambulatory Visit (HOSPITAL_COMMUNITY): Admission: RE | Admit: 2010-07-23 | Discharge: 2010-07-23 | Payer: Self-pay | Admitting: Gastroenterology

## 2010-07-23 HISTORY — PX: COLONOSCOPY: SHX174

## 2010-07-24 ENCOUNTER — Encounter (INDEPENDENT_AMBULATORY_CARE_PROVIDER_SITE_OTHER): Payer: Self-pay | Admitting: *Deleted

## 2010-08-01 ENCOUNTER — Encounter (INDEPENDENT_AMBULATORY_CARE_PROVIDER_SITE_OTHER): Payer: Self-pay | Admitting: *Deleted

## 2010-08-09 ENCOUNTER — Encounter (INDEPENDENT_AMBULATORY_CARE_PROVIDER_SITE_OTHER): Payer: Self-pay | Admitting: *Deleted

## 2010-08-09 ENCOUNTER — Ambulatory Visit: Payer: Self-pay | Admitting: Cardiology

## 2010-08-09 DIAGNOSIS — K8020 Calculus of gallbladder without cholecystitis without obstruction: Secondary | ICD-10-CM | POA: Insufficient documentation

## 2010-08-09 DIAGNOSIS — I679 Cerebrovascular disease, unspecified: Secondary | ICD-10-CM

## 2010-08-12 ENCOUNTER — Encounter (INDEPENDENT_AMBULATORY_CARE_PROVIDER_SITE_OTHER): Payer: Self-pay | Admitting: *Deleted

## 2010-08-12 ENCOUNTER — Encounter: Payer: Self-pay | Admitting: Cardiology

## 2010-08-12 LAB — CONVERTED CEMR LAB
ALT: 10 units/L (ref 0–53)
AST: 14 units/L (ref 0–37)
Albumin: 3.7 g/dL (ref 3.5–5.2)
Alkaline Phosphatase: 68 units/L
BUN: 19 mg/dL
BUN: 19 mg/dL (ref 6–23)
Basophils Absolute: 0.1 10*3/uL
Basophils Absolute: 0.1 10*3/uL (ref 0.0–0.1)
Basophils Relative: 1 %
Basophils Relative: 1 % (ref 0–1)
CO2: 26 meq/L
CO2: 26 meq/L (ref 19–32)
Calcium: 9.2 mg/dL (ref 8.4–10.5)
Chloride: 110 meq/L
Chloride: 110 meq/L (ref 96–112)
Cholesterol: 169 mg/dL
Cholesterol: 169 mg/dL (ref 0–200)
Creatinine, Ser: 1.29 mg/dL
Creatinine, Ser: 1.29 mg/dL (ref 0.40–1.50)
Eosinophils Absolute: 0.2 10*3/uL
Eosinophils Relative: 4 %
Glucose, Bld: 78 mg/dL
HCT: 32.2 %
Hemoglobin: 9.9 g/dL
Hemoglobin: 9.9 g/dL — ABNORMAL LOW (ref 13.0–17.0)
Lymphocytes Relative: 25 % (ref 12–46)
Lymphs Abs: 1.2 10*3/uL
MCHC: 30.7 g/dL
MCV: 86.8 fL
Magnesium: 1.8 mg/dL
Magnesium: 1.8 mg/dL (ref 1.5–2.5)
Monocytes Absolute: 0.2 10*3/uL (ref 0.1–1.0)
Monocytes Relative: 5 % (ref 3–12)
Neutro Abs: 3.1 10*3/uL (ref 1.7–7.7)
Neutrophils Relative %: 65 % (ref 43–77)
Potassium: 4 meq/L (ref 3.5–5.3)
RBC: 3.71 M/uL — ABNORMAL LOW (ref 4.22–5.81)
RDW: 15.1 %
TSH: 1.414 microintl units/mL
TSH: 1.414 microintl units/mL (ref 0.350–4.500)
Total CHOL/HDL Ratio: 3.6

## 2010-08-23 ENCOUNTER — Ambulatory Visit: Payer: Self-pay | Admitting: Cardiology

## 2010-08-26 ENCOUNTER — Encounter: Payer: Self-pay | Admitting: Cardiology

## 2010-09-06 ENCOUNTER — Ambulatory Visit (HOSPITAL_COMMUNITY): Admission: RE | Admit: 2010-09-06 | Discharge: 2010-09-06 | Payer: Self-pay | Admitting: Cardiology

## 2010-09-09 ENCOUNTER — Encounter (INDEPENDENT_AMBULATORY_CARE_PROVIDER_SITE_OTHER): Payer: Self-pay | Admitting: *Deleted

## 2010-09-30 ENCOUNTER — Encounter: Payer: Self-pay | Admitting: Gastroenterology

## 2010-10-17 ENCOUNTER — Encounter: Payer: Self-pay | Admitting: Gastroenterology

## 2010-10-23 ENCOUNTER — Encounter: Payer: Self-pay | Admitting: Gastroenterology

## 2010-11-11 ENCOUNTER — Ambulatory Visit: Admit: 2010-11-11 | Payer: Self-pay | Admitting: Cardiology

## 2010-11-19 NOTE — Letter (Signed)
Summary: DR Gloriajean Dell  DR TUCKER REFERRAL   Imported By: Ave Filter 07/17/2010 11:35:32  _____________________________________________________________________  External Attachment:    Type:   Image     Comment:   External Document  Appended Document: DR Pricilla Holm REFERRAL Pt informed.

## 2010-11-19 NOTE — Letter (Signed)
Summary: HIPPA DISCLOSURE  HIPPA DISCLOSURE   Imported By: Rexene Alberts 05/28/2010 09:13:15  _____________________________________________________________________  External Attachment:    Type:   Image     Comment:   External Document

## 2010-11-19 NOTE — Letter (Signed)
Summary: [facility], agricultural at [employer]  [address], [place] [address]   Phone: [phone]  Fax: [phone]     August 09, 2010 MRN: [id number]   Dominic Sandoval [address] [place], [place]  [address]      YOUR LAB WORK IS DUE   MONDAY   August 12, 2010  Please go to [facility], located across the street from [facility] on the second floor.  Hours are Monday - Friday 7am until 7:30pm         Saturday 8am until 12noon    __  DO NOT EAT OR DRINK AFTER MIDNIGHT EVENING PRIOR TO LABWORK

## 2010-11-19 NOTE — Letter (Signed)
Summary: [facility], agricultural at [facility]  [address] [place] [address]   Phone: [phone]  Fax: [phone]      September 09, 2010 MRN: [id number]   Dominic Sandoval [address] [place], [place]  [address]   Dear Mr. Perfect,  Your test ordered by Selena Batten has been reviewed by your physician (or physician assistant) and was found to be normal or stable. Your physician (or physician assistant) felt no changes were needed at this time.  ____ Echocardiogram  ____ Cardiac Stress Test  ____ Lab Work  _X___ Peripheral vascular study of arms, legs or neck  ____ CT scan or X-ray  ____ Lung or Breathing test  ____ Other:  No change in medical treatment at this time, per Dr. Dietrich Pates. Thank you, Dominic Allyne Gee RN    Addison Bing, MD, Lenise Arena.C.Gaylord Shih, MD, F.A.C.C Lewayne Bunting, MD, F.A.C.C Nona Dell, MD, F.A.C.C Charlton Haws, MD, Lenise Arena.C.C

## 2010-11-19 NOTE — Letter (Signed)
Summary: TCS ORDER  TCS ORDER   Imported By: Ave Filter 07/17/2010 11:34:51  _____________________________________________________________________  External Attachment:    Type:   Image     Comment:   External Document

## 2010-11-19 NOTE — Assessment & Plan Note (Signed)
Summary: WT LOSS/HX OF COLON CANCER   Visit Type:  Follow-up Visit Primary Care Provider:  Felecia Shelling, M.D.  Chief Complaint:  wt loss.  History of Present Illness: Pt has been seen and evaluated for unexplained weight loss since 2008. W/U included EGD/TCS/GES/CTA W/O IVC/MRI ABD. pT HAD A LARGE GALLSTONE, mild gastroparesis, SIMPLE ADENOMAS, and  NORMAL pancreas. Weight in 2008-178 LBS; IN MAY 2009: 166 LBS. TSH: 1.230 IN OCT 2009. Pt says appetite is good if he can get enough food.   Current Medications (verified): 1)  Lipitor 40 Mg  Tabs (Atorvastatin Calcium) .Marland Kitchen.. 1 By Mouth Once Daily 2)  Doxazosin Mesylate 8 Mg  Tabs (Doxazosin Mesylate) .Marland Kitchen.. 1 By Mouth At Bedtime 3)  Hydralazine Hcl 50 Mg  Tabs (Hydralazine Hcl) .Marland Kitchen.. 1 By Mouth Two Times A Day 4)  Diovan 160 Mg  Tabs (Valsartan) .Marland Kitchen.. 1 By Mouth Once Daily 5)  Isosorbide Mononitrate Cr 60 Mg Xr24h-Tab (Isosorbide Mononitrate) .... Once Daily  Allergies (verified): No Known Drug Allergies  Past History:  Past Medical History: Prostate cancer, hx of  treated with XRT Hyperlipidemia Hypertension NIDDM, controlled, no complications Normocytic anemia of CD-Stage 3 CKD, Cr 1.73 SEP 2009 Mild GASTROPARESIS-30% retained contents at 2 hrs gout BPH SIMPLE ADENOMA-TCS 2008 LLL 5mm subpleural pulmonary nodule (Jan 09) cholelithiasis CHF/cardiomyopathy, secondary /iron deficiency bradycardia  Vital Signs:  Patient profile:   77 year old male Height:      76 inches Weight:      166 pounds BMI:     20.28 Temp:     98.0 degrees F oral Pulse rate:   60 / minute BP sitting:   150 / 88  (left arm) Cuff size:   regular  Vitals Entered By: Hendricks Limes LPN (December 12, 2009 3:08 PM)  Physical Exam  General:  Well developed, well nourished, no acute distress. Head:  Normocephalic and atraumatic. Mouth:  No deformity or lesions. Lungs:  Clear throughout to auscultation. Heart:  Regular rate and rhythm; no murmurs Abdomen:  Soft,  nontender and nondistended. Normal bowel sounds. Extremities:  No edema or deformities noted. Neurologic:  Alert and  oriented x4;  grossly normal neurologically.  Impression & Recommendations:  Problem # 1:  WEIGHT LOSS, ABNORMAL (ICD-783.21) Assessment Unchanged Pt's diet limited sometimes because he may not be able to afford food. No S/Sx of significant GI pathology. No additional w/u warranted at this time. OPV in 12 mos.  Problem # 2:  COLON CANCER, HX OF (ICD-V10.05) Assessment: Comment Only Last TCS 2008. Will discuss benefits v. risks of TCS in 2013.  CC: PCP  Other Orders: Est. Patient Level III (54098)

## 2010-11-19 NOTE — Miscellaneous (Signed)
Summary: labs cbcd,cmp,troponin,01/24/2009  Clinical Lists Changes  Observations: Added new observation of TROPONIN I: <0.01 (01/24/2009 11:54) Added new observation of CALCIUM: 9.4 mg/dL (16/07/9603 54:09) Added new observation of ALBUMIN: 3.6 g/dL (81/19/1478 29:56) Added new observation of PROTEIN, TOT: 7.0 g/dL (21/30/8657 84:69) Added new observation of SGPT (ALT): 13 units/L (01/24/2009 11:54) Added new observation of SGOT (AST): 19 units/L (01/24/2009 11:54) Added new observation of ALK PHOS: 77 units/L (01/24/2009 11:54) Added new observation of CREATININE: 2.30 mg/dL (62/95/2841 32:44) Added new observation of BUN: 38 mg/dL (10/22/7251 66:44) Added new observation of BG RANDOM: 104 mg/dL (03/47/4259 56:38) Added new observation of CO2 PLSM/SER: 28 meq/L (01/24/2009 11:54) Added new observation of CL SERUM: 108 meq/L (01/24/2009 11:54) Added new observation of K SERUM: 4.1 meq/L (01/24/2009 11:54) Added new observation of NA: 143 meq/L (01/24/2009 11:54) Added new observation of ABSOLUTE BAS: 0 K/uL (01/24/2009 11:54) Added new observation of BASOPHIL %: 0 % (01/24/2009 11:54) Added new observation of EOS ABSLT: 0.1 K/uL (01/24/2009 11:54) Added new observation of % EOS AUTO: 2 % (01/24/2009 11:54) Added new observation of ABSOLUTE MON: 0.3 K/uL (01/24/2009 11:54) Added new observation of MONOCYTE %: 6 % (01/24/2009 11:54) Added new observation of ABS LYMPHOCY: 0.9 K/uL (01/24/2009 11:54) Added new observation of LYMPHS %: 21 % (01/24/2009 11:54) Added new observation of PLATELETK/UL: 108 K/uL (01/24/2009 11:54) Added new observation of RDW: 15.4 % (01/24/2009 11:54) Added new observation of MCHC RBC: 33.2 g/dL (75/64/3329 51:88) Added new observation of MCV: 87.5 fL (01/24/2009 11:54) Added new observation of HCT: 33.0 % (01/24/2009 11:54) Added new observation of HGB: 10.9 g/dL (41/66/0630 16:01) Added new observation of RBC M/UL: 3.77 M/uL (01/24/2009 11:54) Added new  observation of WBC COUNT: 4.1 10*3/microliter (01/24/2009 11:54)

## 2010-11-19 NOTE — Assessment & Plan Note (Signed)
Summary: iFOBT & care note -- dietary referral needed   History of Present Illness: DIscussed above w/ Dr Darrick Penna and plan of care below.  Discussed w/ daughter, Malvin Johns.  Agrees w/ plan.  Pt needs OV and dietary consult [id number], high calorie diet set up please. pt returned ifobt and it was positive  Allergies: No Known Drug Allergies  Other Orders: Immuno-chemical Fecal Occult ([id number])  Patient Instructions: 1)  Add Reglan 5 mg qac. 2)  Add Remeron 15 mg at bedtime. 3)  Consider EGD and/or Gen Surg Consult for cholecystectomy if Sx not improved after 6 weeks. 4)  OPV in 6 weeks w/ SLF. Prescriptions: REMERON 15 MG TABS (MIRTAZAPINE) 1 by mouth daily  #30 x 2   Entered and Authorized by:   Joselyn Arrow FNP-BC   Signed by:   Joselyn Arrow FNP-BC on 06/03/2010   Method used:   Electronically to        [facility] (retail)       [address]       [place], [place]  [address]       Ph: [phone]       Fax: [phone]   RxID:   [id number] REGLAN 5 MG TABS (METOCLOPRAMIDE HCL) 1 by mouth three times a day (30 minutes before each meal) for slow stomach  #90 x 2   Entered and Authorized by:   Joselyn Arrow FNP-BC   Signed by:   Joselyn Arrow FNP-BC on 06/03/2010   Method used:   Electronically to        [facility] (retail)       [address]       [place], [place]  [address]       Ph: [phone]       Fax: [phone]   RxID:   [id number]   Appended Document: DROPPED OFF STOOL/SS daughter is aware of pt's appt for 07/17/10 @ 1030 w/SF  Appended Document: iFOBT & care note -- dietary referral needed Dietary Referral faxed.

## 2010-11-19 NOTE — Letter (Signed)
Summary: [facility]  [facility]   Imported By: Rexene Alberts 07/18/2010 10:27:52  _____________________________________________________________________  External Attachment:    Type:   Image     Comment:   External Document

## 2010-11-19 NOTE — Assessment & Plan Note (Signed)
Summary: WT LOSS,ABNORMAL LABS PER DR FANTA/SS   Visit Type:  Follow-up Visit Referring Provider:  Dr Felecia Shelling Primary Care Provider:  Dr Felecia Shelling  Chief Complaint:  weight loss and abnormal labs.  History of Present Illness: Referred for evaluation for wt loss & abnl labs.  Pt has anemia of chronic kidney disease and was previously evaluated by Dr Darrick Penna for abnl wt loss.  c/o "feel sick & I can't explain."  c/o fatigue.  Craving ice, cold things.  Denies fever, has chills.  Denies vomiting, but has nausea every day x few weeks. Pt has been seen and evaluated for unexplained weight loss since 2008. W/U included EGD 03/2007, TCS 7/08, GES (30% remains at 2 hrs)/CTA W/O IVC/MRI ABD. Large gallstone->refused surgical consult.  Mild gastroparesis, SIMPLE ADENOMAS & FH colon Ca. C/o anorexia.  Small hemorrhagic right renal cyst 7/08, 5mm LLL nodule on CT stable on 2/09 CT.  Lives alone.  Dietary recall: nothing today, eggs/toast/bologna breakfast yest, no lunch, [employer]-hamburger, sandwich, soda.   BM daily--denies constipation/diarrhea, rectal bleeding or melena.  Left upper ant chest pain w/ sweeping floor. Feels like being hit, lasts seconds.  Denies HA.   Has not had iron nor aranesp since left Dr [facility] practice per pt's daughter. Weight 2008: 178 MAY 2009: 166 Today: 156  Plts:133 CEA 1.5 Hgb 10.4 Hct 31.6 WBC 4.8 ESR 34 LFTs normal    Current Problems (verified): 1)  Fatigue  (ICD-780.79) 2)  Anemia  (ICD-285.9) 3)  Cardiomyopathy, Secondary  (ICD-425.9) 4)  Cataracts  (ICD-366.9) 5)  Gallstones  (ICD-574.20) 6)  Bradycardia  (ICD-427.89) 7)  Nausea  (ICD-787.02) 8)  Gastroparesis  (ICD-536.3) 9)  Anemia, Iron Deficiency  (ICD-280.9) 10)  Hx of Hypokalemia  (ICD-276.8) 11)  Renal Disease, Chronic, Moderate  (ICD-585.3) 12)  Abnormal Chest Xray  (ICD-793.1) 13)  Thrombocytopenia  (ICD-287.5) 14)  CHF  (ICD-428.0) 15)  Allergic Rhinitis  (ICD-477.9) 16)  Anemia of Renal Failure   (ICD-285.21) 17)  Early Satiety  (ICD-789.9) 18)  Weight Loss, Abnormal  (ICD-783.21) 19)  Gout  (ICD-274.9) 20)  Neoplasm, Malignant, Prostate, S/p Radiation Therapy-2004  (ICD-185) 21)  Diabetes Mellitus, Type II, Controlled, w/o Complications  (ICD-250.00) 22)  Hypertension  (ICD-401.9) 23)  Hyperlipidemia  (ICD-272.4) 24)  Colonic Polyps, Adenomatous, Hx of  (ICD-V12.72)  Current Medications (verified): 1)  Doxazosin Mesylate 8 Mg  Tabs (Doxazosin Mesylate) .Marland Kitchen.. 1 By Mouth At Bedtime 2)  Hydralazine Hcl 50 Mg  Tabs (Hydralazine Hcl) .Marland Kitchen.. 1 By Mouth Two Times A Day 3)  Isosorbide Mononitrate Cr 60 Mg Xr24h-Tab (Isosorbide Mononitrate) .... Once Daily 4)  Simvastatin 20 Mg Tabs (Simvastatin) .... Once Daily 5)  Lisinopril 20 Mg Tabs (Lisinopril) .... Once Daily  Allergies (verified): No Known Drug Allergies  Past History:  Past Medical History: Colon CA s/p ?right hemicolectomy 1990 Prostate cancer, hx of  treated with XRT Hyperlipidemia Hypertension NIDDM, controlled, no complications Normocytic anemia of CD-Stage 3 CKD, Cr 1.73 SEP 2009 Mild GASTROPARESIS-30% retained contents at 2 hrs gout BPH SIMPLE ADENOMA-TCS 2008 LLL 5mm subpleural pulmonary nodule (Jan 09) cholelithiasis CHF/cardiomyopathy, secondary /iron deficiency bradycardia  Past Surgical History: prostate CA-XRT-2004 ?right hemicolectomy   Family History: father-deceased-? colon cancer, age 49's mother-deceased-old age Brother-deceased 40-complications from trauma Sisters-  26 HTN,  11   children-    male-50  Social History: Former Smoker-1ppd x15 years-quit >10 years ago Alcohol use-no Drug use-no widowed lives alone, 1 daughter Joan Flores  Review of Systems  See HPI General:  Denies fever, chills, sweats, anorexia, fatigue, weakness, malaise, and sleep disorder. CV:  See HPI. Resp:  Denies dyspnea at rest, dyspnea with exercise, cough, sputum, wheezing, coughing up blood, and  pleurisy. GI:  See HPI; Denies difficulty swallowing, pain on swallowing, jaundice, bloody BM's, black BMs, and fecal incontinence. GU:  Denies urinary burning, blood in urine, urinary frequency, urinary hesitancy, nocturnal urination, and urinary incontinence. Derm:  Denies rash, itching, dry skin, hives, moles, warts, and unhealing ulcers. Psych:  Denies depression, anxiety, memory loss, suicidal ideation, hallucinations, paranoia, phobia, and confusion. Heme:  Denies bruising, bleeding, and enlarged lymph nodes.  Vital Signs:  Patient profile:   77 year old male Height:      76 inches Weight:      156 pounds BMI:     19.06 Temp:     98.4 degrees F oral Pulse rate:   60 / minute BP sitting:   138 / 88  (left arm) Cuff size:   regular  Vitals Entered By: Hendricks Limes LPN (May 27, 2010 11:28 AM)  Physical Exam  General:  Thin NAD Head:  Normocephalic and atraumatic. Eyes:  Sclera clear, no icterus. Ears:  Normal auditory acuity. Nose:  No deformity, discharge,  or lesions. Mouth:  No deformity or lesions. Neck:  Supple; no masses or thyromegaly. Chest Wall:  Symmetrical,  no deformities . Lungs:  Clear throughout to auscultation. Heart:  Regular rate and rhythm; no murmurs Abdomen:  Thin, Soft, nontender and nondistended. No masses, hepatosplenomegaly or hernias noted. Normal bowel sounds. Msk:  Symmetrical with no gross deformities. Normal posture. Pulses:  Normal pulses noted. Extremities:  No edema or deformities noted. Neurologic:  Alert and  oriented x4;  grossly normal neurologically. Skin:  Intact without significant lesions or rashes. Cervical Nodes:  No significant cervical adenopathy. Psych:  Alert and cooperative. Normal mood and affect.  Impression & Recommendations:  Problem # 1:  WEIGHT LOSS, ABNORMAL (ICD-783.21) 77 y/o black male w/ hx chronic nausea, prostate ca, colon ca, anemia CKD, gastroparesis, CHF, large gallstone, progressive weight loss over 30+  # over several yrs. Pt has not been on aranesp or iron for quite some time.  Last creatinine 1.54.  ? IDA/anemia panel pending.  CEA normal.  Extensive GI work-up previously as above.  Poor oral intake/decreased calories.  ? gastroparesis, non-GI malignancy such as prostate/bladder CA, PUD, recurrent colon ca.  ?occult GI/GU blood loss.  Begin PPI.  Consider reglan trial pending labwork & trial PPI.  Orders: T-B12, Serum Total Only (47829) T-Folate [phone]) T-Iron Binding Capacity (TIBC) ([id number]) T-Ferritin ([id number]) T-Reticulocyte Count, Automated ([id number]) T-Urinalysis (66440-34742)  Problem # 2:  GASTROPARESIS (ICD-536.3)  See #1  Orders: Est. Patient Level IV (59563)  Problem # 3:  ANEMIA (ICD-285.9) See #1  Problem # 4:  Hx of COLON CANCER (ICD-153.9)  See #1  Orders: Est. Patient Level IV (87564)  Patient Instructions: 1)  Get labwork & urine today 2)  Return iFOBT asap 3)  Keep record daily meals 5-6 small meals 4)  Dexilant #4 boxes samples given, take 1 pill per day 5)  Follow up regarding chest pain/dizziness w/ Dr Felecia Shelling 6)  The medication list was reviewed and reconciled.  All changed / newly prescribed medications were explained.  A complete medication list was provided to the patient / caregiver.  Appended Document: WT LOSS,ABNORMAL LABS PER DR FANTA/SS ANEMIA OF CHRONIC DISEASE. NO additional GI workup required.

## 2010-11-19 NOTE — Letter (Signed)
Summary: Appointment - Reminder 2  [facility] [place]. [address] [place] [address]   Phone: [phone]  Fax: [phone]     July 24, 2010 MRN: [id number]   Dominic Sandoval [address] [place], [place]  [address]   Dear Mr. Steinhart,  Our records indicate that it is time to schedule a follow-up appointment.  Dr.    Dietrich Pates      recommended that you follow up with [place] in     03/2010       . It is very important that we reach you to schedule this appointment. We look forward to participating in your health care needs. Please contact us at the number listed above at your earliest convenience to schedule your appointment.  If you are unable to make an appointment at this time, give [place] a call so we can update our records.     Sincerely,   Glass blower/designer

## 2010-11-19 NOTE — Letter (Signed)
Summary: BP LOG  BP LOG   Imported By: Faythe Ghee 08/26/2010 11:17:07  _____________________________________________________________________  External Attachment:    Type:   Image     Comment:   External Document

## 2010-11-19 NOTE — Assessment & Plan Note (Signed)
Summary: PAST DUE FOR F/U PER PT PHONE CALL/TG   Visit Type:  Follow-up Referring Provider:  . Primary Provider:  Dr. Avon Gully   History of Present Illness: Dominic Sandoval returns to the office as scheduled for continued assessment and treatment of a history of cardiomyopathy, congestive heart failure and sinus bradycardia.  Since his last visit, he has done well from a cardiac standpoint.  He reports no dyspnea, orthopnea, chest discomfort, lightheadedness, syncope or pedal edema.  He was found to have Hemoccult positive stools.  Colonoscopy revealed polyps, which were excised, and hemorrhoids.  Current Medications (verified): 1)  Doxazosin Mesylate 8 Mg  Tabs (Doxazosin Mesylate) .Marland Kitchen.. 1 By Mouth At Bedtime 2)  Isosorbide Mononitrate Cr 60 Mg Xr24h-Tab (Isosorbide Mononitrate) .... Once Daily 3)  Lisinopril 20 Mg Tabs (Lisinopril) .... Once Daily 4)  Reglan 5 Mg Tabs (Metoclopramide Hcl) .Marland Kitchen.. 1 By Mouth Three Times A Day (30 Minutes Before Each Meal) For Slow Stomach 5)  Remeron 15 Mg Tabs (Mirtazapine) .Marland Kitchen.. 1 By Mouth Daily 6)  Amlodipine Besylate 10 Mg Tabs (Amlodipine Besylate) .... Take One Tablet By Mouth Daily  Allergies (verified): No Known Drug Allergies  Comments:  Nurse/Medical Assistant: patient brought med bottles and reviewed previous med list from ov  daughter states meds are correct not taking dexilant 60 mg anymore.  Past History:  PMH, FH, and Social History reviewed and updated.  Past Medical History: Colon CA s/p ?right hemicolectomy 1990 **SIMPLE ADENOMA-TCS 2008, 2011 with Hemoccult positive stool LARGE GALLSTONE Normocytic anemia of CD-Stage 3 CKD, Cr 1.73 SEP 2009 Mild GASTROPARESIS-30% retained contents at 2 hrs NIDDM, controlled, no complications Weight loss Prostate cancer, hx of  treated with XRT CHF/cardiomyopathy: EF of 35-40% in early 2009, but nl in 7/09; 3/09-mild to mod. CAD; 50% LAD; 30% RCA/CX Bradycardia-sinus: rate as low as 34  on Holter in 2010; asymptomatic Hyperlipidemia Hypertension Gout BPH LLL 5mm subpleural pulmonary nodule (Jan 09)  Past Surgical History: Prostate CA-XRT-2004 Hemicolectomy-?Right-carcinoma of the colon  EKG  Procedure date:  08/09/2010  Findings:      Sinus bradycardia with a heart rate of 47 bpm Right ventricular conduction delay Minor nonspecific T wave abnormality Prominent QRS voltage Comparison with prior tracing of 12/23/07: Axis has shifted to the right; delayed R-wave progression no longer present; ST-T wave abnormalities markedly improved.   Review of Systems       See history of present illness.  Vital Signs:  Patient profile:   77 year old male Height:      76 inches Weight:      160 pounds BMI:     19.55 Pulse rate:   54 / minute BP sitting:   184 / 82  (right arm)  Vitals Entered By: Dreama Saa, CNA (August 09, 2010 2:50 PM)  Physical Exam  General:  Thin; well developed; no acute distress:   Neck-No JVD; soft bilateral carotid bruits: Lungs-No tachypnea, no rales; no rhonchi; no wheezes; decreased breath sounds at the bases Cardiovascular-normal PMI; normal S1 and S2; S4; modest systolic murmur; bounding pulses Abdomen-BS normal; soft and non-tender without masses or organomegaly:  Musculoskeletal-No deformities, no cyanosis or clubbing: Neurologic-Normal cranial nerves; symmetric strength and tone:  Skin-Warm, area of lichenification and increased pigmentation over the mid back: Extremities-Nl distal pulses; no edema:     Impression & Recommendations:  Problem # 1:  CAROTID BRUITS, BILATERAL (ICD-785.9) Carotid Duplex (Carotid Duplex)  Problem # 2:  ANEMIA (ICD-285.9) No recent assessments are available  other than iron studies; a CBC will be obtained.  Patient was previously treated with an ESA, but none has been prescribed since Dr. Virgilio Belling practice closed.  Problem # 3:  CARDIOMYOPATHY-NONISCHEMIC (ICD-425.4) As of his most recent  echocardiogram, there was substantial recovery of left ventricular systolic function.  He likely does not require maximal medical therapy for this problem.  Due to concerns about weight loss and anorexia, hydralazine and simvastatin, which are the 2 drugs he is taking most likely to cause adverse GI effects, will be discontinued.  Problem # 4:  BRADYCARDIA (ICD-427.89) [name] has sinus node dysfunction with asymptomatic sinus bradycardia.  I described the possible need for pacemaker in the future and the procedure for implantation.  For now, and no intervention is required.  Problem # 5:  HYPERTENSION (ICD-401.9) There is substantial systolic hypertension with an increased pulse pressure.  This may partially be due to his bradycardia, but treatment is clearly warranted.  Amlodipine 5 mg q.d. will be added to his medical regime.  Problem # 6:  HYPERLIPIDEMIA (ICD-272.4) A lipid profile will be obtained.  I will plan to reassess this nice gentleman in 3 months to verify that hypertension has been adequately controlled.Marland Kitchen  He and his family have been cautioned to call should he develop lightheadedness, syncope, dyspnea or other cardiac symptoms.  Other Orders: Future Orders: T-Comprehensive Metabolic Panel ([id number]) ... 08/12/2010 T-CBC w/Diff ([id number]) ... 08/12/2010 T-Lipid Profile [id number]) ... 08/12/2010 T-TSH [id number]) ... 08/12/2010 T-Magnesium [phone]) ... 08/12/2010  Patient Instructions: 1)  Your physician recommends that you schedule a follow-up appointment in: 3 MONTHS 2)  Your physician recommends that you return for lab work KZ:SWFU WEEK 3)  Your physician has recommended you make the following change in your medication: STOP HYDRALAZINE,STOP SIMVASTATIN, START AMLODIIPINE 10MG  DAILY 4)  You have been referred to NURSE VISIT IN 2 WEEKS 5)  Your physician has requested that you regularly monitor and record your blood pressure readings at home.  Please use  the same machine at the same time of day to check your readings and record them to bring to your follow-up visit. PLEASE BRING BP DIARY TO NURSE VISIT Prescriptions: AMLODIPINE BESYLATE 10 MG TABS (AMLODIPINE BESYLATE) Take one tablet by mouth daily  #30 x 3   Entered by:   Teressa Lower RN   Authorized by:   Kathlen Brunswick, MD, [facility]   Signed by:   Teressa Lower RN on 08/09/2010   Method used:   Electronically to        [facility] (retail)       [address]       [place], [place]  [address]       Ph: [phone]       Fax: [phone]   RxID:   [id number]

## 2010-11-19 NOTE — Assessment & Plan Note (Signed)
Summary: 2 wk nurse visit per checkout on 08/09/10/tg  Nurse Visit   Vital Signs:  Patient profile:   77 year old male Weight:      156 pounds BMI:     19.06 Pulse rate:   62 / minute BP sitting:   132 / 69  (right arm)  Vitals Entered By: Dreama Saa, CNA (August 23, 2010 3:59 PM)  Visit Type:  Nurse Visit. Referring Provider:  . Primary Provider:  Dr. Avon Gully   History of Present Illness: S: Pt. returns to office for nurse visit.  B: on last OV with Dr. Dietrich Pates pt. was advised to stop taking Hydralazine ans Simvastatin and to start taking Amlodipine 10mg  by mouth once daily for HTN with an increase in pulse pressure. A: Pt. has no complaints at this time. He brought in medications and states he is taking as directed. He brought in BP diary with 6 readings taken from 11-1 through 11-4, list scanned into chart. BP today is 132/69 with a pulse of 62. R: Will call pt. with Dr. Marvel Plan recommendations, if any.   08/31/10 Continue current Rx. [place] Bing, M.D.    Current Medications (verified): 1)  Doxazosin Mesylate 8 Mg  Tabs (Doxazosin Mesylate) .Marland Kitchen.. 1 By Mouth At Bedtime 2)  Isosorbide Mononitrate Cr 60 Mg Xr24h-Tab (Isosorbide Mononitrate) .... Once Daily 3)  Lisinopril 20 Mg Tabs (Lisinopril) .... Once Daily 4)  Reglan 5 Mg Tabs (Metoclopramide Hcl) .Marland Kitchen.. 1 By Mouth Three Times A Day (30 Minutes Before Each Meal) For Slow Stomach 5)  Remeron 15 Mg Tabs (Mirtazapine) .Marland Kitchen.. 1 By Mouth Daily 6)  Amlodipine Besylate 10 Mg Tabs (Amlodipine Besylate) .... Take One Tablet By Mouth Daily  Allergies (verified): No Known Drug Allergies

## 2010-11-19 NOTE — Letter (Signed)
Summary: Dietary Referal  Dietary Referal   Imported By: Ave Filter 06/09/2010 12:47:22  _____________________________________________________________________  External Attachment:    Type:   Image     Comment:   External Document

## 2010-11-19 NOTE — Assessment & Plan Note (Signed)
Summary: HEME POS STOOL, WEIGHT LOSS, FOOT BLISTERS   Visit Type:  Follow-up Visit Primary Care Provider:  Felecia Shelling, M.D.  Chief Complaint:  follow up- doing well.  History of Present Illness: Saw the nutrionist twice. Things working out pretty good. No nausea or vomiting. iFOBT pos. No pain in his belly. No BRBPR or melena. No problems swallowing. Sx better for past 3-4 mos. CV/O BLISTERS ON RIGHT FOOT. Now has one between his 1st and 2nd toe.  Current Medications (verified): 1)  Doxazosin Mesylate 8 Mg  Tabs (Doxazosin Mesylate) .Marland Kitchen.. 1 By Mouth At Bedtime 2)  Hydralazine Hcl 50 Mg  Tabs (Hydralazine Hcl) .Marland Kitchen.. 1 By Mouth Two Times A Day 3)  Isosorbide Mononitrate Cr 60 Mg Xr24h-Tab (Isosorbide Mononitrate) .... Once Daily 4)  Simvastatin 20 Mg Tabs (Simvastatin) .... Once Daily 5)  Lisinopril 20 Mg Tabs (Lisinopril) .... Once Daily 6)  Reglan 5 Mg Tabs (Metoclopramide Hcl) .Marland Kitchen.. 1 By Mouth Three Times A Day (30 Minutes Before Each Meal) For Slow Stomach 7)  Remeron 15 Mg Tabs (Mirtazapine) .Marland Kitchen.. 1 By Mouth Daily 8)  Dexilant 60 Mg Cpdr (Dexlansoprazole) .... Once Daily  Allergies (verified): No Known Drug Allergies  Past History:  Past Medical History: Colon CA s/p ?right hemicolectomy 1990 **SIMPLE ADENOMA-TCS 2008 LARGE GALLSTONE Normocytic anemia of CD-Stage 3 CKD, Cr 1.73 SEP 2009 Mild GASTROPARESIS-30% retained contents at 2 hrs NIDDM, controlled, no complications  Prostate cancer, hx of  treated with XRT Hyperlipidemia Hypertension Gout BPH LLL 5mm subpleural pulmonary nodule (Jan 09) CHF/cardiomyopathy Bradycardia  Vital Signs:  Patient profile:   77 year old male Height:      76 inches Weight:      158 pounds BMI:     19.30 Temp:     98.7 degrees F oral Pulse rate:   60 / minute BP sitting:   128 / 80  (left arm) Cuff size:   regular  Vitals Entered By: Hendricks Limes LPN (July 17, 2010 10:43 AM)  Physical Exam  General:  Well developed, well  nourished, no acute distress. Head:  Normocephalic and atraumatic. Eyes:  PERRL, no icterus. Mouth:  No deformity or lesions. Neck:  Supple; no masses. Lungs:  Clear throughout to auscultation. Heart:  Regular rate and rhythm; no murmurs Abdomen:  Thin, Soft, nontender and nondistended. Normal bowel sounds. Extremities:  Blister on right foot between first and second toe. No edema. Neurologic:  Alert and  oriented x4;  grossly normal neurologically.  Impression & Recommendations:  Problem # 1:  HEMOCCULT POSITIVE STOOL (ICD-578.1) TCS OCT 4-HALFLYTELY.   Problem # 2:  WEIGHT LOSS, ABNORMAL (ICD-783.21) Assessment: Improved Gained 2 lbs. CONTINUE CURRENT DIET. OPV IN 6 MOS.  Problem # 3:  Hx of COLON CANCER (ICD-153.9) HEME POS STOOLS tcs NEXT WEEK.  Problem # 4:  BLISTERS (ICD-919.2) ON RIGHT FOOT. PODIATRY CONSULT.  cc: PCP  Appended Document: HEME POS STOOL, WEIGHT LOSS, FOOT BLISTERS F/U OV IS IN THE COMPUTER  Appended Document: Orders Update    Clinical Lists Changes  Orders: Added new Service order of Est. Patient Level IV (16109) - Signed      Appended Document: HEME POS STOOL, WEIGHT LOSS, FOOT BLISTERS SAW PODIATRY OCT 2011.

## 2010-11-21 NOTE — Letter (Signed)
Summary: [facility]  [facility]   Imported By: Rexene Alberts 10/23/2010 14:23:35  _____________________________________________________________________  External Attachment:    Type:   Image     Comment:   External Document

## 2010-11-21 NOTE — Letter (Signed)
Summary: PROGRESS NOTE  PROGRESS NOTE   Imported By: Rexene Alberts 09/30/2010 13:24:32  _____________________________________________________________________  External Attachment:    Type:   Image     Comment:   External Document

## 2010-11-21 NOTE — Letter (Signed)
Summary: CLINIC NOTE FROM [facility]  CLINIC NOTE FROM [facility]   Imported By: Rexene Alberts 10/17/2010 08:48:24  _____________________________________________________________________  External Attachment:    Type:   Image     Comment:   External Document

## 2010-11-29 ENCOUNTER — Encounter (INDEPENDENT_AMBULATORY_CARE_PROVIDER_SITE_OTHER): Payer: Self-pay | Admitting: *Deleted

## 2010-12-02 ENCOUNTER — Encounter (INDEPENDENT_AMBULATORY_CARE_PROVIDER_SITE_OTHER): Payer: Self-pay | Admitting: *Deleted

## 2010-12-03 ENCOUNTER — Ambulatory Visit (INDEPENDENT_AMBULATORY_CARE_PROVIDER_SITE_OTHER): Payer: MEDICARE | Admitting: Cardiology

## 2010-12-03 ENCOUNTER — Encounter: Payer: Self-pay | Admitting: Cardiology

## 2010-12-03 DIAGNOSIS — I10 Essential (primary) hypertension: Secondary | ICD-10-CM

## 2010-12-03 DIAGNOSIS — I428 Other cardiomyopathies: Secondary | ICD-10-CM

## 2010-12-05 NOTE — Letter (Signed)
Summary: Recall Office Visit  [facility]  [address]   [place], [place] [address]   Phone: [phone]  Fax: [phone]      November 29, 2010   Dominic Sandoval [address] [place], [place]  [address] August 18, 1931   Dear [name],   According to our records, it is time for you to schedule a follow-up office visit with [place].   At your convenience, please call [phone] to schedule an office visit. If you have any questions, concerns, or feel that this letter is in error, we would appreciate your call.   Sincerely,    Diana Eves  [facility] Ph: [phone]   Fax: [phone]

## 2010-12-11 NOTE — Miscellaneous (Signed)
Summary: carotids 09/06/2010  Clinical Lists Changes  Observations: Added new observation of US CAROTID:   Findings:    RIGHT CAROTID ARTERY:  Plaque formation right carotid bulb in the   proximal right ICA, partially calcified.  Mildly turbulent flow on   color Doppler imaging in right ICA, which is mildly tortuous.   Minimal spectral  broadening right ICA at tortuosity.  No high   velocity jets.    RIGHT VERTEBRAL ARTERY:  Patent, antegrade    LEFT CAROTID ARTERY: Small scattered plaques at left carotid bulb   into proximal ICA.  A single plaque shows questionable ulceration   at the proximal left ICA.  Laminar flow by color Doppler imaging.   No high velocity jets or significant turbulence.    LEFT VERTEBRAL ARTERY:  Patent, antegrade    IMPRESSION:   Mild plaque formation bilaterally at the carotid bifurcations.   No evidence of hemodynamically significant stenosis.    Question ulcerated plaque at proximal left ICA; recommend   assessment by CTA or MRA imaging to exclude plaque ulceration.    Read By:  Lollie Marrow,  M.D.   Released By:  Lollie Marrow,  M.D.  (09/06/2010 10:10)      Carotid Doppler  Procedure date:  09/06/2010  Findings:        Findings:    RIGHT CAROTID ARTERY:  Plaque formation right carotid bulb in the   proximal right ICA, partially calcified.  Mildly turbulent flow on   color Doppler imaging in right ICA, which is mildly tortuous.   Minimal spectral  broadening right ICA at tortuosity.  No high   velocity jets.    RIGHT VERTEBRAL ARTERY:  Patent, antegrade    LEFT CAROTID ARTERY: Small scattered plaques at left carotid bulb   into proximal ICA.  A single plaque shows questionable ulceration   at the proximal left ICA.  Laminar flow by color Doppler imaging.   No high velocity jets or significant turbulence.    LEFT VERTEBRAL ARTERY:  Patent, antegrade    IMPRESSION:   Mild plaque formation bilaterally at the carotid  bifurcations.   No evidence of hemodynamically significant stenosis.    Question ulcerated plaque at proximal left ICA; recommend   assessment by CTA or MRA imaging to exclude plaque ulceration.    Read By:  Lollie Marrow,  M.D.   Released By:  Lollie Marrow,  Judie Petit.D.

## 2010-12-11 NOTE — Miscellaneous (Signed)
Summary: cbcd,cmp,lipids,08/12/2010  Clinical Lists Changes  Observations: Added new observation of MAGNESIUM: 1.8 mg/dL (11/91/4782 95:62) Added new observation of CALCIUM: 9.2 mg/dL (13/05/6577 46:96) Added new observation of ALBUMIN: 3.7 g/dL (29/52/8413 24:40) Added new observation of PROTEIN, TOT: 6.6 g/dL (08/16/2535 64:40) Added new observation of SGPT (ALT): 10 units/L (08/12/2010 10:10) Added new observation of SGOT (AST): 14 units/L (08/12/2010 10:10) Added new observation of ALK PHOS: 68 units/L (08/12/2010 10:10) Added new observation of CREATININE: 1.29 mg/dL (34/74/2595 63:87) Added new observation of BUN: 19 mg/dL (56/43/3295 18:84) Added new observation of BG RANDOM: 78 mg/dL (16/60/6301 60:10) Added new observation of CO2 PLSM/SER: 26 meq/L (08/12/2010 10:10) Added new observation of CL SERUM: 110 meq/L (08/12/2010 10:10) Added new observation of K SERUM: 4.0 meq/L (08/12/2010 10:10) Added new observation of NA: 145 meq/L (08/12/2010 10:10) Added new observation of LDL: 113 mg/dL (93/23/5573 22:02) Added new observation of HDL: 47 mg/dL (54/27/0623 76:28) Added new observation of TRIGLYC TOT: 47 mg/dL (31/51/7616 07:37) Added new observation of CHOLESTEROL: 169 mg/dL (10/62/6948 54:62) Added new observation of TSH: 1.414 microintl units/mL (08/12/2010 10:10) Added new observation of ABSOLUTE BAS: 0.1 K/uL (08/12/2010 10:10) Added new observation of BASOPHIL %: 1 % (08/12/2010 10:10) Added new observation of EOS ABSLT: 0.2 K/uL (08/12/2010 10:10) Added new observation of % EOS AUTO: 4 % (08/12/2010 10:10) Added new observation of ABSOLUTE MON: 0.2 K/uL (08/12/2010 10:10) Added new observation of MONOCYTE %: 5 % (08/12/2010 10:10) Added new observation of ABS LYMPHOCY: 1.2 K/uL (08/12/2010 10:10) Added new observation of LYMPHS %: 25 % (08/12/2010 10:10) Added new observation of PLATELETK/UL: 140 K/uL (08/12/2010 10:10) Added new observation of RDW: 15.1 % (08/12/2010  10:10) Added new observation of MCHC RBC: 30.7 g/dL (70/35/0093 81:82) Added new observation of MCV: 86.8 fL (08/12/2010 10:10) Added new observation of HCT: 32.2 % (08/12/2010 10:10) Added new observation of HGB: 9.9 g/dL (99/37/1696 78:93) Added new observation of RBC M/UL: 3.71 M/uL (08/12/2010 10:10) Added new observation of WBC COUNT: 4.7 10*3/microliter (08/12/2010 10:10)

## 2010-12-11 NOTE — Assessment & Plan Note (Signed)
Summary: 3 month follow up/rm   Visit Type:  Follow-up Referring Provider:  . Primary Provider:  Dr. Avon Gully   History of Present Illness: [name] returns the office as scheduled for continued assessment and treatment of a cardiomyopathy out of proportion to the extent of coronary disease, chronic anemia and multiple cardiovascular risk factors including hypertension and dyslipidemia.  [name] has done well from a cardiac standpoint.  He denies chest discomfort, exertional dyspnea, orthopnea, PND, lightheadedness, syncope and peripheral edema.  He has had continuing weight loss over the past 3 years with a decrease of 10 pounds since October and 50 pounds since 2009.  He has had chronic anemia, which has been attributed to his very mild renal disease in the past and was treated with an ESA.    Current Medications (verified): 1)  Doxazosin Mesylate 8 Mg  Tabs (Doxazosin Mesylate) .Marland Kitchen.. 1 By Mouth At Bedtime 2)  Lisinopril 20 Mg Tabs (Lisinopril) .... Once Daily 3)  Amlodipine Besylate 10 Mg Tabs (Amlodipine Besylate) .... Take One Tablet By Mouth Daily 4)  Simvastatin 20 Mg Tabs (Simvastatin) .... Take 1 Tab Daily  Allergies (verified): No Known Drug Allergies  Comments:  Nurse/Medical Assistant: patient brought meds reviewed with patient also his pharmacy is [facility] [place]  Past History:  PMH, FH, and Social History reviewed and updated.  Past Medical History: ASCVD-nonobstructive; 50% LAD in 12/2007, mild pulmonary hypertension, elevated PCW; 7/09-normal EF;       h/o CHF in 2/09 with EF of 35-40% on echo Sinus node dysfunction-has not tolerated beta blocker Hyperlipidemia Hypertension Colon CA s/p ?right hemicolectomy 1990 Adenoma-TCS and polypectomy 2008, 2011 with Hemoccult positive stool Cholelithiasis Normocytic anemia of CD-Stage 3 CKD, Cr 1.73 SEP 2009;  1.3 in 07/2010 Mild gastroparesis-30% retained contents at 2 hrs NIDDM, controlled, no  complications Weight loss Prostate cancer, hx of  treated with XRT CHF/cardiomyopathy: EF of 35-40% in early 2009, but nl in 7/09; 3/09-mild to mod. CAD; 50% LAD; 30% RCA/CX Bradycardia-sinus: rate as low as 34 on Holter in 2010; asymptomatic Gout BPH LLL 5mm subpleural pulmonary nodule (Jan 09)  Review of Systems       See history of present illness.  Vital Signs:  Patient profile:   77 year old male Weight:      151 pounds BMI:     18.45 O2 Sat:      91 % on Room air Pulse rate:   69 / minute BP sitting:   122 / 63  (left arm)  Vitals Entered By: Dreama Saa, CNA (December 03, 2010 3:11 PM)  O2 Flow:  Room air  Physical Exam  General:  Thin; well developed; no acute distress:   Neck-No JVD; normal carotid upstrokes without bruits: Lungs-No tachypnea, no rales; no rhonchi; no wheezes; decreased breath sounds at the bases Cardiovascular-normal PMI; normal S1 and S2; S4; modest systolic murmur; bounding pulses Abdomen-BS normal; soft and non-tender without masses or organomegaly:  Musculoskeletal-No deformities, no cyanosis or clubbing: Neurologic-Normal cranial nerves; symmetric strength and tone:  Skin-Warm, no significant abnormalities Extremities-Nl distal pulses; no edema:     Impression & Recommendations:  Problem # 1:  ASCVD-NONOBSTRUCTIVE (ICD-429.2) Patient initially presented with CHF and moderately impaired LV systolic function out of proportion to very mild coronary disease.  EF subsequently normalized.  There is no reason to suspect progression of coronary disease.  Current approach of optimizing control of risk factors will be continued.  Problem # 2:  CHRONIC  KIDNEY DISEASE: STAGE II-III (ICD-585.9) Creatinine has improved since 2009.  Problem # 3:  ANEMIA (ICD-285.21) Patient has moderate anemia without clear etiology.   He previously had Hemoccult-positive stools and worse renal function.  At this point, anemia should be fully reevaluated, perhaps  with referral to Dr. Mariel Sleet.  Anemia and weight loss may be related and reflect a serious underlying chronic condition;  a bone marrow examination might be helpful.  An office visit with Dr. Felecia Shelling as planned in the near future at which time further assessment and treatment can be considered.  An anemia profile is pending.  Problem # 4:  HYPERTENSION (ICD-401.9) Blood pressure control is markedly improved compared with his previous visit; current medication will be continued.  Problem # 5:  HYPERLIPIDEMIA (ICD-272.4) Lipid profile is suboptimal in this gentleman with coronary disease, albeit nonobstructive.  Due to his advanced age and absence of symptomatic vascular disease, pharmacologic therapy will not be introduced.  CHOL: 169 (08/12/2010)   LDL: 113 (08/12/2010)   HDL: 47 (08/12/2010)   TG: 47 (08/12/2010)  Problem # 6:  CAROTID BRUITS, BILATERAL (ICD-785.9) Carotid ultrasound shows mild atherosclerotic changes without significant focal disease.  No further assessment of the cerebral circulation is warranted.  Other Orders: T-CBC w/Diff [id number]) T-Iron Binding Capacity (TIBC) ([id number]) T-Folate [phone]) T-Ferritin [phone]) Augusto Gamble [phone])  Patient Instructions: 1)  Your physician recommends that you schedule a follow-up appointment in: 1 year 2)  Your physician recommends that you return for lab work [id number] 3)  Your physician has recommended you make the following change in your medication: stop isosorbide

## 2010-12-19 ENCOUNTER — Encounter: Payer: Self-pay | Admitting: Gastroenterology

## 2010-12-24 ENCOUNTER — Ambulatory Visit (INDEPENDENT_AMBULATORY_CARE_PROVIDER_SITE_OTHER): Payer: MEDICARE | Admitting: Gastroenterology

## 2010-12-24 ENCOUNTER — Encounter: Payer: Self-pay | Admitting: Gastroenterology

## 2010-12-24 DIAGNOSIS — C189 Malignant neoplasm of colon, unspecified: Secondary | ICD-10-CM

## 2010-12-24 DIAGNOSIS — R634 Abnormal weight loss: Secondary | ICD-10-CM

## 2010-12-29 IMAGING — US US CAROTID DUPLEX BILAT
1 series · 13 of 24 positions shown · non-contrast
Comparison: None

CLINICAL DATA: Carotid bruits, history smoking, diabetes,
hypertension, MI

BILATERAL CAROTID DUPLEX ULTRASOUND
TECHNIQUE: Gray scale imaging, color Doppler and duplex ultrasound
was performed of bilateral carotid and vertebral arteries in the
neck.

[Series 1: us carotid duplex bilat · 0.06mm/px · 13 of 67 slices shown]
[im 1/67]
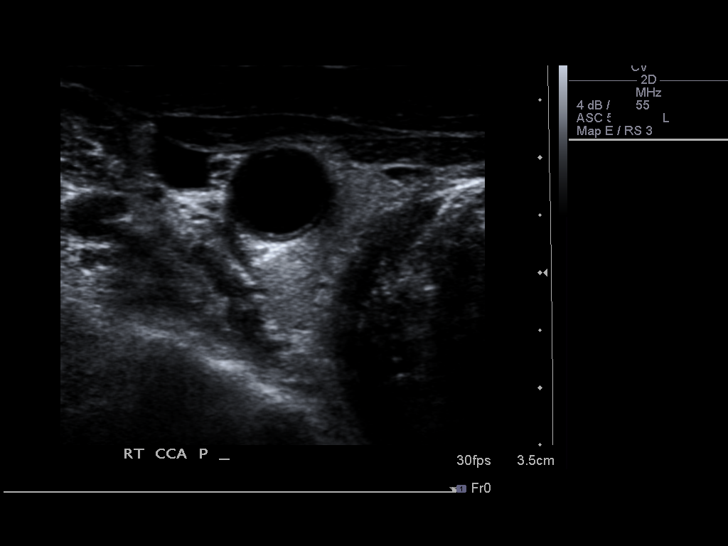
[im 6/67]
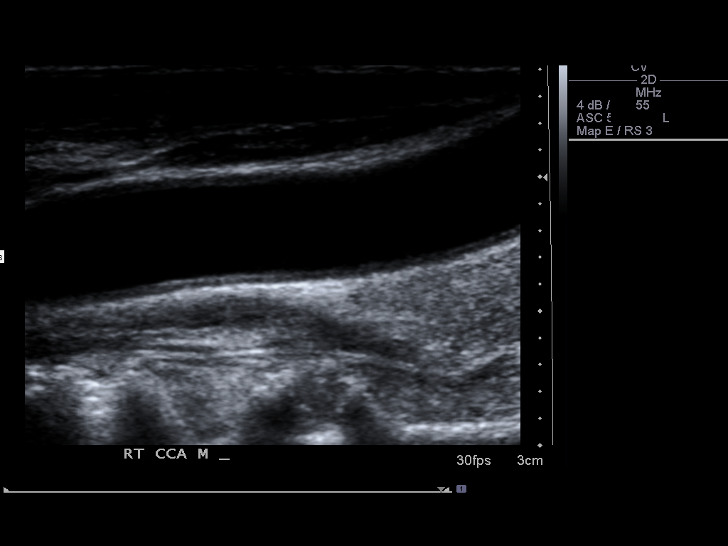
[im 12/67]
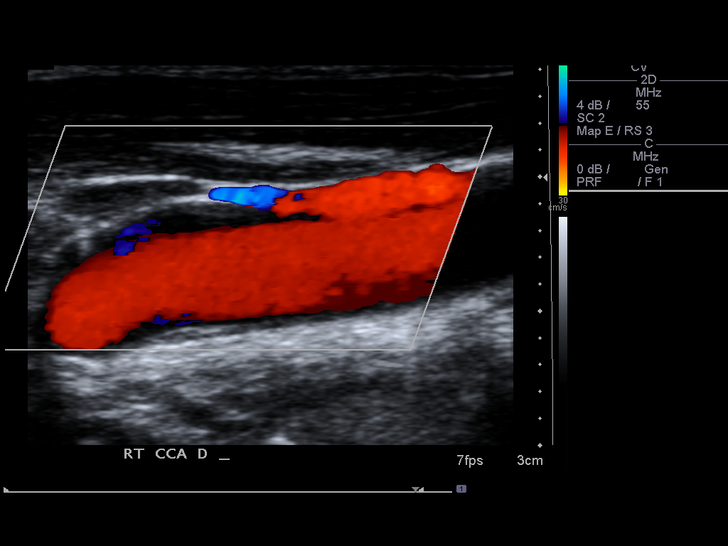
[im 18/67]
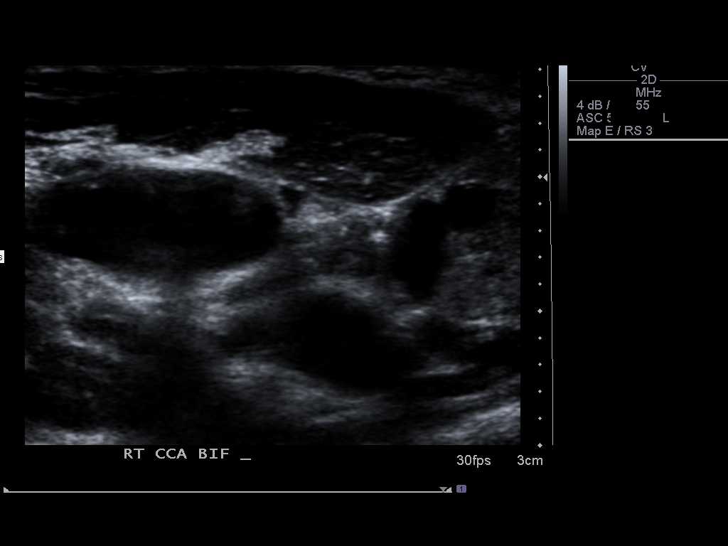
[im 23/67]
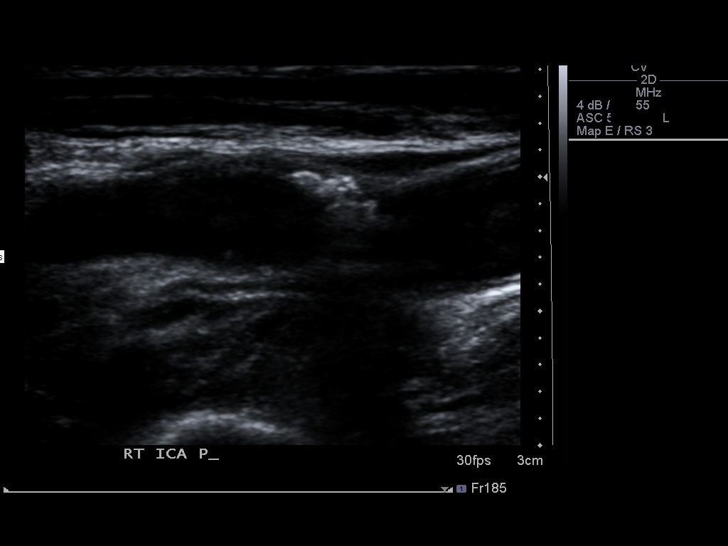
[im 29/67]
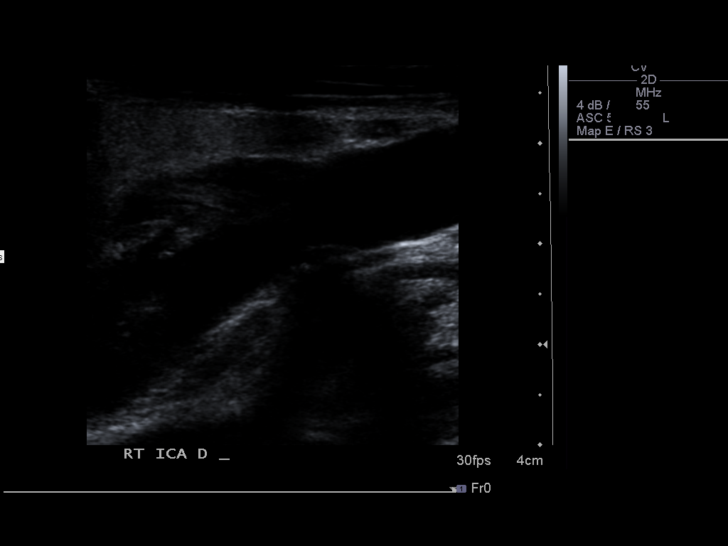
[im 35/67]
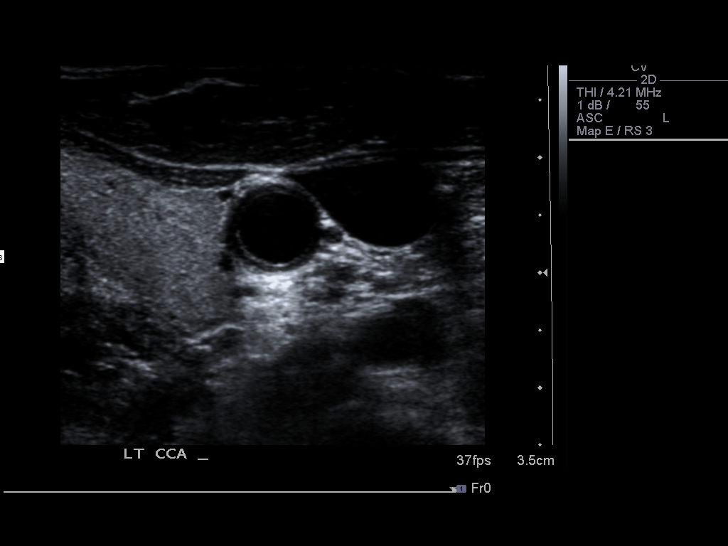
[im 38/67]
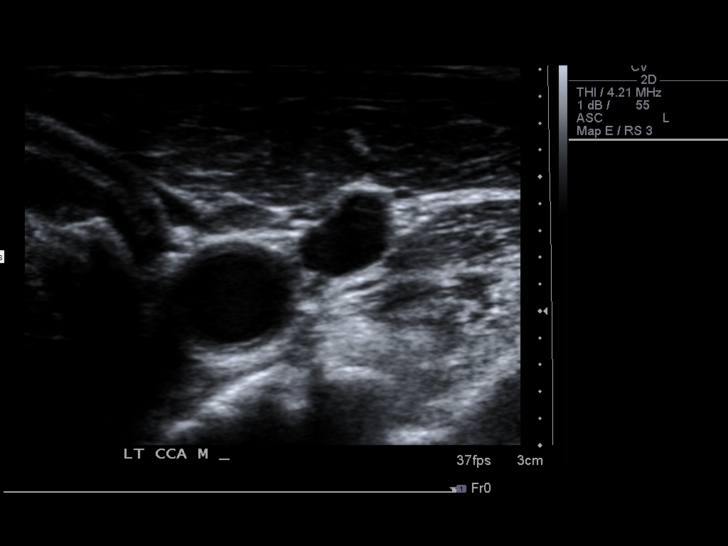
[im 44/67]
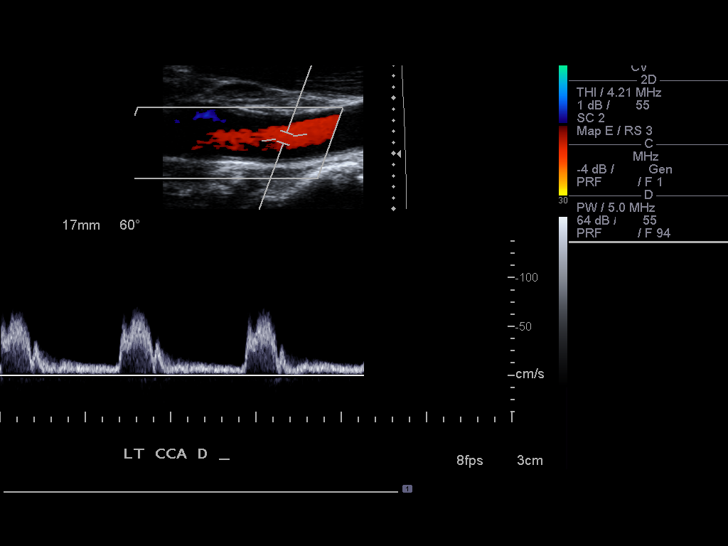
[im 49/67]
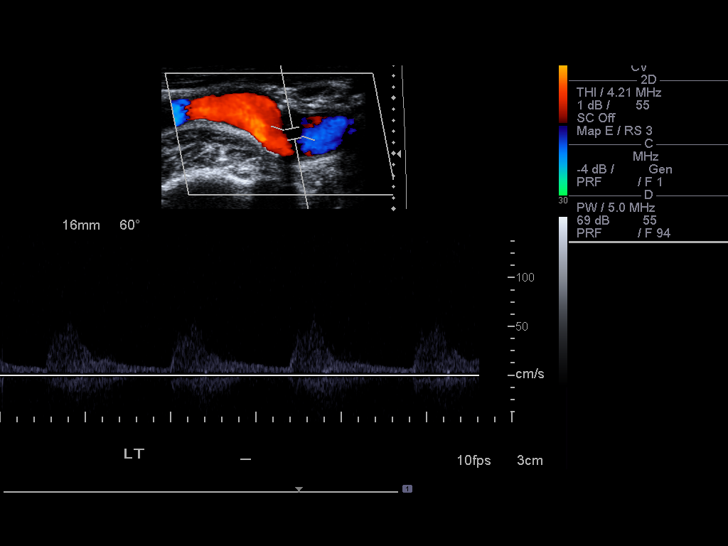
[im 55/67]
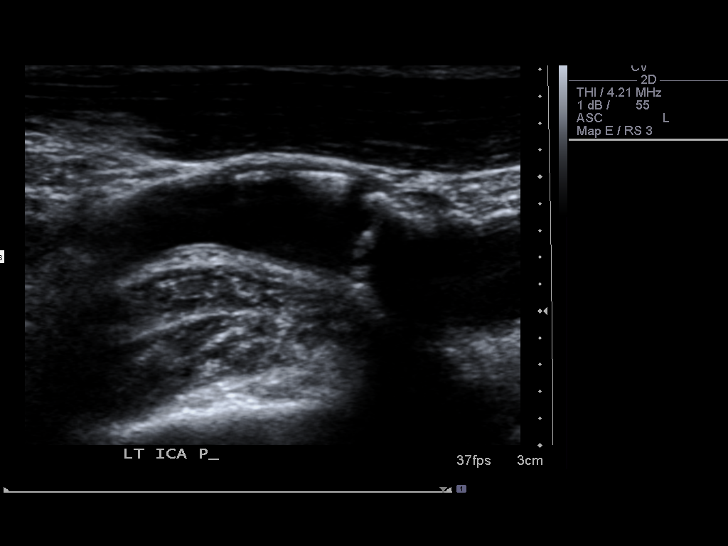
[im 61/67]
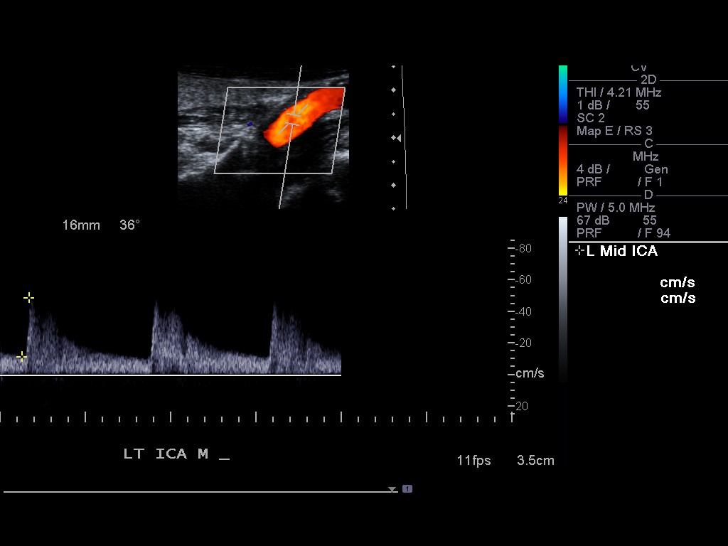
[im 67/67]
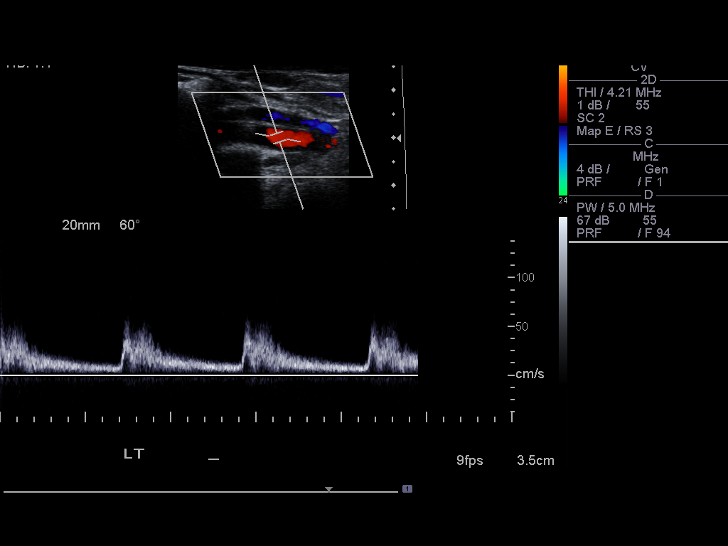

[13 of 24 positions shown; findings below may reference images not displayed]

Criteria:  Quantification of carotid stenosis is based on velocity
parameters that correlate the residual internal carotid diameter
with NASCET-based stenosis levels, using the diameter of the distal
internal carotid lumen as the denominator for stenosis measurement.

The following velocity measurements were obtained:

                 PEAK SYSTOLIC/END DIASTOLIC
RIGHT
ICA:                        91/15cm/sec
CCA:                        88/14cm/sec
SYSTOLIC ICA/CCA RATIO:
DIASTOLIC ICA/CCA RATIO:
ECA:                        35/4cm/sec

LEFT
ICA:                        56/10cm/sec
CCA:                           73/11 cm/sec
SYSTOLIC ICA/CCA RATIO:
DIASTOLIC ICA/CCA RATIO:
ECA:                        62/5cm/sec
FINDINGS: RIGHT CAROTID ARTERY:  Plaque formation right carotid bulb in the
proximal right ICA, partially calcified.  Mildly turbulent flow on
color Doppler imaging in right ICA, which is mildly tortuous.
Minimal spectral  broadening right ICA at tortuosity.  No high
velocity jets.

RIGHT VERTEBRAL ARTERY:  Patent, antegrade

LEFT CAROTID ARTERY: Small scattered plaques at left carotid bulb
into proximal ICA.  A single plaque shows questionable ulceration
at the proximal left ICA.  Laminar flow by color Doppler imaging.
No high velocity jets or significant turbulence.

LEFT VERTEBRAL ARTERY:  Patent, antegrade
IMPRESSION: Mild plaque formation bilaterally at the carotid bifurcations.
No evidence of hemodynamically significant stenosis.

Question ulcerated plaque at proximal left ICA; recommend
assessment by CTA or MRA imaging to exclude plaque ulceration.

## 2010-12-31 NOTE — Assessment & Plan Note (Signed)
Summary: FU OV   Vital Signs:  Patient profile:   77 year old male Height:      74 inches Weight:      161 pounds BMI:     20.75 Temp:     98.3 degrees F oral Pulse rate:   60 / minute BP sitting:   128 / 50  (left arm)  Vitals Entered By: Carolan Clines LPN (December 24, 7423 2:20 PM)  Primary Care Provider:  Dr. Avon Gully   History of Present Illness: Pt presents today in f/u from colonoscopy done Oct 2011. Hx of colon ca with left hemicolectomy. Doing well today, up 3 lbs. No lack of appetite. No melena or brbpr. BM about every day. No abdominal pain. No N/V.   Allergies (verified): No Known Drug Allergies  Past History:  Past Medical History: Reviewed history from 12/03/2010 and no changes required. ASCVD-nonobstructive; 50% LAD in 12/2007, mild pulmonary hypertension, elevated PCW; 7/09-normal EF;       h/o CHF in 2/09 with EF of 35-40% on echo Sinus node dysfunction-has not tolerated beta blocker Hyperlipidemia Hypertension Colon CA s/p ?right hemicolectomy 1990 Adenoma-TCS and polypectomy 2008, 2011 with Hemoccult positive stool Cholelithiasis Normocytic anemia of CD-Stage 3 CKD, Cr 1.73 SEP 2009;  1.3 in 07/2010 Mild gastroparesis-30% retained contents at 2 hrs NIDDM, controlled, no complications Weight loss Prostate cancer, hx of  treated with XRT CHF/cardiomyopathy: EF of 35-40% in early 2009, but nl in 7/09; 3/09-mild to mod. CAD; 50% LAD; 30% RCA/CX Bradycardia-sinus: rate as low as 34 on Holter in 2010; asymptomatic Gout BPH LLL 5mm subpleural pulmonary nodule (Jan 09)   Other Orders: T-CBC w/Diff [id number])   Orders Added: 1)  T-CBC w/Diff [[id number]]  Appended Document: FU OV     Primary Care Provider:  Dr. Avon Gully   History of Present Illness: Pt presents today in f/u from colonoscopy done Oct 2011. Hx of colon ca with left hemicolectomy. Doing well today, up 3 lbs. No lack of appetite. No melena or brbpr. BM about every day. No  abdominal pain. No N/V.   Colonoscopy Oct 2011: small internal hemorrhoids. Next due in 3-5 years.   Current Medications (verified): 1)  Doxazosin Mesylate 8 Mg  Tabs (Doxazosin Mesylate) .Marland Kitchen.. 1 By Mouth At Bedtime 2)  Lisinopril 20 Mg Tabs (Lisinopril) .... Once Daily 3)  Amlodipine Besylate 10 Mg Tabs (Amlodipine Besylate) .... Take One Tablet By Mouth Daily 4)  Simvastatin 20 Mg Tabs (Simvastatin) .... Take 1 Tab Daily 5)  Isosorbide Mononitrate Cr 60 Mg Xr24h-Tab (Isosorbide Mononitrate) .... Take One Once Daily  Allergies: No Known Drug Allergies  Past History:  Past Medical History: Last updated: 12/03/2010 ASCVD-nonobstructive; 50% LAD in 12/2007, mild pulmonary hypertension, elevated PCW; 7/09-normal EF;       h/o CHF in 2/09 with EF of 35-40% on echo Sinus node dysfunction-has not tolerated beta blocker Hyperlipidemia Hypertension Colon CA s/p ?right hemicolectomy 1990 Adenoma-TCS and polypectomy 2008, 2011 with Hemoccult positive stool Cholelithiasis Normocytic anemia of CD-Stage 3 CKD, Cr 1.73 SEP 2009;  1.3 in 07/2010 Mild gastroparesis-30% retained contents at 2 hrs NIDDM, controlled, no complications Weight loss Prostate cancer, hx of  treated with XRT CHF/cardiomyopathy: EF of 35-40% in early 2009, but nl in 7/09; 3/09-mild to mod. CAD; 50% LAD; 30% RCA/CX Bradycardia-sinus: rate as low as 34 on Holter in 2010; asymptomatic Gout BPH LLL 5mm subpleural pulmonary nodule (Jan 09)  Review of Systems General:  Denies fever, chills, and  anorexia. Eyes:  Denies blurring, irritation, and discharge. ENT:  Denies sore throat, hoarseness, and difficulty swallowing. CV:  Denies chest pains and syncope. Resp:  Denies dyspnea at rest and wheezing. GI:  See HPI. GU:  Denies urinary burning and urinary frequency. MS:  Denies joint pain / LOM, joint swelling, and joint stiffness. Derm:  Denies rash, itching, and dry skin. Neuro:  Denies weakness and syncope. Psych:   Denies depression and anxiety.  Physical Exam  General:  Well developed, well nourished, no acute distress. Head:  Normocephalic and atraumatic. Lungs:  Clear throughout to auscultation. Heart:  S1 S2 present Abdomen:  +BS, soft, thin, non-tender, non-distended. no HSM or rebound/guarding Msk:  Symmetrical with no gross deformities. Normal posture. Neurologic:  Alert and  oriented x4;  grossly normal neurologically. Skin:  Intact without significant lesions or rashes. Psych:  Alert and cooperative. Normal mood and affect.   Impression & Recommendations:  Problem # 1:  Hx of COLON CANCER (ICD-23.50)  77 year old male with hx of colon ca, s/p hemicolectomy, with most recent TCS in Oct 2011. Source of heme+ stools at that time likely r/t internal hemorrhoids. No melena or brbpr. Next TCS in 3-5 years. Pt doing well without any problems.   Continue high fiber diet F/U in 3 mos for wt check TCS in 3-5 years  Orders: Est. Patient Level II (40102)  Problem # 2:  WEIGHT LOSS, ABNORMAL (ICD-783.21)  up 3 lbs from Sept. Now 161. Pt denies loss of appetite. Doing well. Return in 3 mos for reassessment.   Orders: Est. Patient Level II (72536)   Orders Added: 1)  Est. Patient Level II [64403]

## 2011-01-02 LAB — BASIC METABOLIC PANEL
CO2: 25 mEq/L (ref 19–32)
Chloride: 110 mEq/L (ref 96–112)
Creatinine, Ser: 1.44 mg/dL (ref 0.4–1.5)
GFR calc Af Amer: 58 mL/min — ABNORMAL LOW (ref >60)

## 2011-01-03 ENCOUNTER — Encounter: Payer: Self-pay | Admitting: Gastroenterology

## 2011-01-03 LAB — CONVERTED CEMR LAB
Eosinophils Absolute: 0.1 10*3/uL (ref 0.0–0.7)
Eosinophils Relative: 3 % (ref 0–5)
HCT: 29.2 % — ABNORMAL LOW (ref 39.0–52.0)
Lymphs Abs: 1.1 10*3/uL (ref 0.7–4.0)
MCV: 86.4 fL (ref 78.0–100.0)
Monocytes Relative: 6 % (ref 3–12)
Platelets: 131 10*3/uL — ABNORMAL LOW (ref 150–400)
RBC: 3.38 M/uL — ABNORMAL LOW (ref 4.22–5.81)
WBC: 4.7 10*3/uL (ref 4.0–10.5)

## 2011-01-07 NOTE — Miscellaneous (Addendum)
Summary: Orders Update  Clinical Lists Changes  Orders: Added new Test order of T-CBC w/Diff ([id number]) - Signed 

## 2011-02-24 ENCOUNTER — Other Ambulatory Visit: Payer: Self-pay | Admitting: Gastroenterology

## 2011-02-24 LAB — CBC WITH DIFFERENTIAL/PLATELET
Basophils Absolute: 0 10*3/uL (ref 0.0–0.1)
Eosinophils Absolute: 0.3 10*3/uL (ref 0.0–0.7)
Eosinophils Relative: 7 % — ABNORMAL HIGH (ref 0–5)
MCH: 27.9 pg (ref 26.0–34.0)
MCV: 87.2 fL (ref 78.0–100.0)
Platelets: 139 10*3/uL — ABNORMAL LOW (ref 150–400)
RDW: 15.4 % (ref 11.5–15.5)

## 2011-03-04 NOTE — Letter (Signed)
January 13, 2008    Erle Crocker, M.D.  [address]  [place], [place] [address]   RE:  LEHMAN, WHITELEY  MRN:  [id number]  /  DOB:  1931-03-24   Dear Wynona Canes:   Mr. Ringwald returns to the office for continued assessment and treatment  of congestive heart failure.  Since the last visit, he has done  superbly.  He is now asymptomatic.   CURRENT MEDICATIONS:  1. Doxazosin 4 mg daily.  2. Atorvastatin 40 mg daily.  3. Hydralazine 50 mg t.i.d.  4. Diovan 160 mg daily.  5. Spironolactone 25 mg daily.  6. Furosemide 80 mg daily.   PHYSICAL EXAMINATION:  GENERAL APPEARANCE:  Pleasant, thin gentleman in  no acute distress.  VITAL SIGNS:  The weight is 175, 21 pounds less than two weeks ago.  Blood pressure 130/50, heart rate 66 and regular, respirations 18.  NECK:  No jugular venous distention; no carotid bruits.  LUNGS:  Clear.  CARDIAC:  Normal first and second heart sounds; grade 2/6 nearly  holosystolic murmur at the left sternal border.  ABDOMEN:  Soft and nontender; no organomegaly.  EXTREMITIES:  No edema.   IMPRESSION:  Mr. Rigel does have a substantial weight loss with  excellent control of congestive heart failure.  He is definitely ready  for cardiac catheterization.  We will obtain that study and let you know  the results as soon as they are available.  I will reassess this nice  gentleman and further adjust his medications once catheterization has  been completed.    Sincerely,      Gerrit Friends. Dietrich Pates, MD, [facility]  Electronically Signed    RMR/MedQ  DD: 01/13/2008  DT: 01/14/2008  Job #: [id number]

## 2011-03-04 NOTE — Assessment & Plan Note (Signed)
NAMELEROY, PETTWAY                CHART#:  [id number]   DATE:  07/21/2007                       DOB:  October 11, 1931   REFERRING PHYSICIAN:  Erle Crocker, M.D.   PROBLEM LIST:  1. Mild gastroparesis with 30% of contents remaining in 2 hours.  2. Adenomatous polyps diagnosed in July 2008.  3. Large gallstone in bladder.  4. Hemorrhagic cyst on right kidney.  5. Subpleural nodule in the left lower lobe.  6. Renal insufficiency.  7. History of abdominal pain and weight loss.   SUBJECTIVE:  The patient is a 77 year old male who presents as a return  patient visit.  He was last seen in August of 2008.  His weight was 178  pounds.  He presents today with a weight os 189 pounds.  He says his  appetite is good.  He denies any abdominal pain, nausea or vomiting.  He  is maintained only on aspirin and blood pressure pill.  He states his  maximum weight was 240-250 pounds.   ALLERGIES:  NO KNOWN DRUG ALLERGIES.   MEDICATIONS:  1. Aspirin 81 mg daily.  2. Amlodipine 10 mg daily.   OBJECTIVE:  Weight 189 pounds, height 6 foot 4 inches, body mass index  23 (healthy), temperature 98, blood pressure 110/70, pulse 56.  GENERAL:  He is no apparent distress, alert and oriented x4.  LUNGS:  Clear to auscultation bilaterally.  CARDIOVASCULAR:  Regular rhythm no murmur.  ABDOMEN:  Bowel sounds present, soft, nontender and nondistended.  No  rebound. No guarding.   ASSESSMENT:  The patient is a 77 year old male who initially presented  in June of 2008 with weight loss and fullness in his upper abdomen.  His  workup revealed only mild gastroparesis.  He also was noted to have a  large gallstone in his gallbladder and a left subpleural nodule which  needs subsequent CT followup.  He is no longer having abdominal pain and  he is gaining weight.  Thank you for allowing me to see the patient in  consultation my recommendations follow.   RECOMMENDATIONS:  1. The patient knows that he has a large  gallstone and will likely      need a laparoscopic cholecystectomy.  2. He should continue following his current diet.  3. He should have a CT scan in January of 2009 to followup his left      subpleural nodule.  I did speak with Dr. Jen Mow office and they are      aware of that.  4. He may follow up to see me in 12 months.  He may follow up to see      me sooner if he develops problems with nausea or vomiting.       Kassie Mends, M.D.  Electronically Signed     SM/MEDQ  D:  07/21/2007  T:  07/21/2007  Job:  [id number]   cc:   Erle Crocker, M.D.

## 2011-03-04 NOTE — Op Note (Signed)
NAME:  Dominic Sandoval, Dominic Sandoval               ACCOUNT NO.:  [id number]   MEDICAL RECORD NO.:  [id number]          PATIENT TYPE:  AMB   LOCATION:  DAY                           FACILITY:  [facility]   PHYSICIAN:  Kassie Mends, M.D.      DATE OF BIRTH:  Dec 07, 1930   DATE OF PROCEDURE:  04/19/2007  DATE OF DISCHARGE:                               OPERATIVE REPORT   PROCEDURE:  Esophagogastroduodenoscopy   INDICATION FOR EXAM:  Mr. Burgo is a 77 year old male who presents with  a 20-40 pound weight loss over the last year.  He also has decreased  taste.  He denies any nausea or vomiting.  He has fullness in his upper  abdomen.  His esophagogastroduodenoscopy is being performed to evaluate  new onset dyspepsia and weight loss.   FINDINGS:  1. Normal esophagus without evidence of Barrett's, erosions,      ulcerations or mass.  2. Normal stomach without evidence of erosions, ulceration or mass.  3. Normal duodenal bulb and second portion of the duodenum.   RECOMMENDATIONS:  1. No source for his weight loss, early satiety and upper abdominal      discomfort. He should resume his previous diet but add protein      powder 2 scoops daily to his food.  2. He will be scheduled for a CT scan of the abdomen and pelvis with      IV and p.o. contrast to further evaluate his weight loss, early      satiety and upper abdominal discomfort.  3. He should have a followup appointment to see me in 2 weeks.  I will      call him with the results of the CT scan of his abdomen and his      pelvis.  If the CT scan of the abdomen and pelvis does not reveal      an etiology for his symptoms then he will be scheduled for a      colonoscopy.   LABS:  White count 5.4, hemoglobin 11, platelets 155,000, BUN 17,  creatinine 1.75, albumin 3.7, total protein 6.9, AST 21, ALT 16,  alkaline phosphatase 89, total bilirubin 0.9 - labs drawn on April 19, 2007.   MEDICATIONS:  1. Demerol 50 mg IV.  2. Versed 4 mg IV.   PROCEDURE  TECHNIQUE:  Physical exam was performed and informed consent  was obtained from the patient after explaining the benefits, risks and  alternatives to the procedure.  The patient was connected to the monitor  and placed in the left lateral position.  Continuous oxygen was provided  by nasal cannula and IV medicine administered through an indwelling  cannula.  After administration of sedation, the patient's esophagus was  intubated and the  scope was advanced under direct visualization to the second portion of  the duodenum.  The scope was removed slowly by carefully examining the  color, texture, anatomy and integrity of the mucosa on the way out.  The  patient was recovered in Endoscopy and discharged home in satisfactory  condition.      Sandi  Cira Servant, M.D.  Electronically Signed     SM/MEDQ  D:  04/20/2007  T:  04/20/2007  Job:  [id number]   cc:   Erle Crocker, M.D.

## 2011-03-04 NOTE — Procedures (Signed)
Dominic, Sandoval               ACCOUNT NO.:  [id number]   MEDICAL RECORD NO.:  [id number]          PATIENT TYPE:  OUT   LOCATION:  RAD                           FACILITY:  [facility]   PHYSICIAN:  Pricilla Riffle, MD, FACCDATE OF BIRTH:  1931-04-08   DATE OF PROCEDURE:  DATE OF DISCHARGE:                                ECHOCARDIOGRAM   REFERRING PHYSICIAN:  Dr. Jen Mow.   TEST INDICATION:  The patient is a 76 year old with CHF, hypertension.   2-D echo with echo Doppler:  Left ventricle is mildly dilated at 59 mm.  The interventricular septum and posterior wall are mildly thickened at  15 mm each.  Left atrium is moderately dilated at 50 mm.  Right atrium  and right ventricle are grossly normal.   The aortic valve is mildly sclerotic.  There is no insufficiency.  Mitral valve is mildly thickened with moderate insufficiency, 2/4.  Pulmonic valve is normal with mild insufficiency, 1/4.  Tricuspid valve  is normal with mild insufficiency (1/4 estimated).  Estimated PA  pressure by TR jet is approximately 70 to 80 mmHg.   Overall LV systolic function appears to be depressed with an LVEF of  approximately 35% to 40%, noted inferior hypo- to akinesis.  RVEF is  grossly normal.   Small pericardial effusion is seen most prominent inferior to the RV.  There does not appear to be any filling compromise.  IVC is mildly  dilated but constricts with inspiration.      Pricilla Riffle, MD, [facility]  Electronically Signed     PVR/MEDQ  D:  11/29/2007  T:  11/30/2007  Job:  [id number]

## 2011-03-04 NOTE — Letter (Signed)
January 24, 2008    Erle Crocker, M.D.  [facility]  [address], [address]  [place], [place] [address]   RE:  COTY, LARSH  MRN:  [id number]  /  DOB:  Jun 01, 1931   Dear Wynona Canes:   Mr. Buchler returns to the office after recent admission a [facility] for cardiac catheterization.  This study revealed no  significant coronary disease, the worst lesion being a 50% blockage in  the left anterior descending coronary artery.  He had only mild to  moderate elevation and right-sided pressures with a moderate elevation  and pulmonary capillary wedge pressure.  Further medical therapy was  recommended.  Mr. Leonhardt has felt well in recent days with no dyspnea  and no chest discomfort.  He has noted no edema.  Weight loss over the  past few weeks has totaled 20 pounds.   CURRENT MEDICATIONS:  1 . Furosemide 80 mg daily.  1. Isosorbide mononitrate 60 mg daily.  2. Spironolactone 25 mg daily.  3. Diovan 160 mg daily.  4. Hydralazine 50 mg 3 times a day.  5. Atorvastatin 40 mg daily.   PHYSICAL EXAMINATION:  GENERAL:  Lanky, pleasant gentleman in no acute  distress.  VITAL SIGNS:  The weight is 173, 2 pounds less than in March.  Blood  pressure 115/75, heart rate 70 and regular.  Respirations 14.  NECK:  No jugular venous distention; no carotid bruits.  LUNGS:  Clear.  CARDIAC:  Normal first heart sound; slightly accentuated second heart  sound with physiologic splitting.  Minimal systolic murmur at the apex.  ABDOMEN:  Soft and nontender; no organomegaly.  EXTREMITIES:  No edema; benign catheterization site.   IMPRESSION:  Mr. Deroo is doing beautifully from a cardiac standpoint.  With no symptoms and progressive weight loss, I suspect that his wedge  pressure is somewhat lower at the present time.  We will continue his  current medical regime, which is quite effective an appropriate.  Ultimately, we could increase his dose of hydralazine, isosorbide  mononitrate and Diovan.   He has a moderate anemia with a hemoglobin of about 10 and a normal MCV.  Iron studies and stool for hemoccult testing will be obtained.  I  suggested Mr. Koestner schedule a followup appointment with you to further  assess his anemia.  It may be due to his chronic kidney disease, but his  creatinine level is fairly low to account for an anemia of this  magnitude.   Mr. Cavanah has a borderline ejection fraction to warrant implantation of  a defibrillator.  We will defer that procedure for the time being.  I  will see him again in 3 months.    Sincerely,      Gerrit Friends. Dietrich Pates, MD, [facility]  Electronically Signed    RMR/MedQ  DD: 01/24/2008  DT: 01/24/2008  Job #: [id number]

## 2011-03-04 NOTE — Op Note (Signed)
NAME:  Dominic Sandoval, Dominic Sandoval               ACCOUNT NO.:  [id number]   MEDICAL RECORD NO.:  [id number]          PATIENT TYPE:  AMB   LOCATION:  DAY                           FACILITY:  [facility]   PHYSICIAN:  Kassie Mends, M.D.      DATE OF BIRTH:  [birth year]   DATE OF PROCEDURE:  05/04/2007  DATE OF DISCHARGE:                               OPERATIVE REPORT   REFERRING PHYSICIAN:  Erle Crocker, MD.   PROCEDURE:  Colonoscopy with snare polypectomy.   ENDOSCOPIST:  Kassie Mends, MD.   INDICATION FOR EXAMINATION:  [name] is a 77 year old male, who  presented with abdominal pain and weight loss.  He  had an upper  endoscopy, CT, and MRI, which showed no etiology for Dominic abdominal pain  and weight loss.  He is presenting today to evaluate abdominal pain,  weight loss, and for screening.   FINDINGS:  1. A 6 mm transverse colon polyp removed via snare cautery.  2. A 3 mm transverse colon polyp removed via gold snare.  3. An 8 mm sessile sigmoid colon polyp removed via snare cautery.  4. Otherwise, no masses, inflammatory changes, diverticula, or AVMs.      Normal retroflexed view of the rectum.   RECOMMENDATIONS:  1. No aspirin, NSAIDs, or anticoagulation for seven days.  2. He should follow a high fiber diet.  He is given a handout on high      fiber diet and polyps.  3. We will call Dominic Sandoval with the results of Dominic biopsies.  If Dominic      polyp is adenomatous, then he should have a screening colonoscopy      in five years.  If Dominic polyp is adenomatous, then Dominic Sandoval      should have a screening colonoscopy at age 31 and then every 10      years because an adenomatous polyp would be diagnosed after the age      of 89.   MEDICATIONS:  1. Demerol 50 mg IV.  2. Versed 4 mg IV.   PROCEDURE TECHNIQUE:  Physical exam was performed, and informed consent  was obtained from the patient after explaining the benefits, risks, and  alternatives to the procedure.  The patient was connected to  the monitor  and placed in the left lateral position.  Continuous oxygen was provided  by nasal cannula, and IV medicine measured through an indwelling  cannula.  After administration of sedation and rectal exam, the  patient's rectum was intubated  and the scope was advanced under direct visualization to the cecum.  The  scope was removed slowly by carefully examining the color, texture,  anatomy, and integrity of the mucosa on the way out.  The patient was  recovered in Endoscopy and discharged home in satisfactory condition.      Kassie Mends, M.D.  Electronically Signed     SM/MEDQ  D:  05/04/2007  T:  05/05/2007  Job:  [id number]   cc:   Erle Crocker, M.D.

## 2011-03-04 NOTE — Cardiovascular Report (Signed)
NAMEIOKEPA, GEFFRE               ACCOUNT NO.:  [id number]   MEDICAL RECORD NO.:  [id number]          PATIENT TYPE:  OIB   LOCATION:  1962                         FACILITY:  [facility]   PHYSICIAN:  Arturo Morton. Riley Kill, MD, FACCDATE OF BIRTH:  [birth year]   DATE OF PROCEDURE:  01/19/2008  DATE OF DISCHARGE:                            CARDIAC CATHETERIZATION   INDICATIONS:  Mr. Guidice is a 77 year old gentleman presenting with  congestive heart failure and left ventricular dysfunction.  He had  progressive dyspnea on exertion and peripheral edema.  He was found to  have an EF of 35-40%, mild to moderate LVH with inferior wall  hypokinesis and moderate mitral regurgitation with elevated right-sided  pressures.  He is noted to have renal insufficiency with creatinine in  the 1.8-1.9 range, and also, is noted to be modestly anemic.  He was  brought in after evaluation by Dr. Dietrich Pates for cardiac catheterization  after careful consideration.  He was brought in early for bicarb drip.  The current study was done to assess right heart pressures and coronary  anatomy.   PROCEDURES:  1. Left and right heart catheterization.  2. Selective coronary arteriography.   DESCRIPTION OF PROCEDURE:  The patient was brought to the  catheterization laboratory and prepped and draped in the usual fashion  after bicarb drip had been administered for more than an hour.  Through  an anterior puncture the right femoral vein was entered.  We then  performed saturation in the superior vena cava followed by sequential  right heart pressures, pulmonary artery saturation was obtained.  There  was a step-up initially, but we went back and obtained a second superior  vena cava saturation which demonstrated no significant difference  between to pulmonary artery saturations and the second superior vena  cava saturation.  Thermodilution cardiac outputs were performed.  Following this, the right femoral artery was entered and  a 4-French  sheath was placed.  The pigtail was placed in the central aorta and  saturation obtained.  Following this, ventricular pressure was recorded.  Simultaneous wedge LV was then recorded.  Following this, simultaneous  RV/LV was recorded.  Following this, a repeat superior vena cava  saturation was obtained and an aortic pullback was performed.  Ventriculography and aortic root aortography were not performed because  of contrast limitations.  Following this, routine Judkins catheters were  utilized to engage the left and right coronary arteries and angiographic  studies performed.  Contrast was limited to less than 50 mL. Because of  elevated pressure, he was given 10 mg of intravenous hydralazine.  He  was taken to the holding area for sheath removal.  His family was  informed of the results of the catheterization and I spoke with Dr.  Dietrich Pates in detail.   HEMODYNAMIC DATA:  1. Right atrial pressure was 7-8 mean.  2. RV 48/12.  3. Pulmonary artery 37/15, mean 23.  4. Pulmonary capillary wedge 17 mean.  At simultaneous LV wedge, the      wedge was 23 with V-waves ranging from 40-45.  5. LV 193/33.  6. Aortic 191/72  mean 120.  7. Thermodilution cardiac output 4.6 liters per minute.  8. Fick cardiac index 2.2 liters per minute per m2.  9. Dilution cardiac output 6.7 liters per minute.  10.Thermodilution cardiac index 3.2 liters per minute per m2.  11.Aortic saturation 94%.  12.Superior vena cava saturation 69%.  13.Pulmonary artery saturation 70%.   ANGIOGRAPHIC DATA:  1. On plain fluoroscopy, there was evidence of diffuse calcification      but no high-grade critical obstruction.  2. The ventriculogram and aortic root aortography were not performed      as noted above.  3. Calcification was noted in the coronary arteries as noted above.  4. Left main was free of critical disease.  5. The LAD coursed to the apex.  There is diffuse calcification in the      LAD.  In the  midvessel is probably about 40-50% area of focal      calcified stenosis.  Likewise, more distally after a third      diagonal, there is 30-40% narrowing with calcified stenosis as      well.  There are several small diagonal branches all of which have      ostial disease that is hard to grade due to the very small caliber      of the diagonal vessels themselves.  These are not amenable to      treatment.  6. There is a ramus intermedius that is large and has about 30%      narrowing in its midportion.  7. There is an AV circumflex that is large and has about 30% narrowing      in its proximal portion and is calcified.  8. The right coronary artery provides posterior descending and      posterolateral system and there is some diffuse luminal      irregularity with about 30% mid-narrowing.   CONCLUSIONS:  1. Noncritical coronary artery disease.  2. Pulmonary hypertension with elevated left ventricular end diastolic      pressure and fairly prominent V-wave.   RECOMMENDATIONS:  1. Aggressive blood pressure control.  2. Follow-up with Dr. Dietrich Pates.  3. Consideration of treatment of anemia and evaluation as such.  4. Question of whether or not TEE for evaluation of the mitral valve      for structural disease is warranted versus careful blood pressure      control.  This will be determined by Dr. Dietrich Pates during follow-up      visits.      Arturo Morton. Riley Kill, MD, [facility]  Electronically Signed     TDS/MEDQ  D:  01/19/2008  T:  01/19/2008  Job:  [id number]   cc:   Gerrit Friends. Dietrich Pates, MD, [facility]

## 2011-03-04 NOTE — Assessment & Plan Note (Signed)
NAME:  Dominic Sandoval, Dominic Sandoval                CHART#:  [id number]   DATE:                                   DOB:  Sep 07, 1931   PROBLEM LIST:  1. Abdominal pain and weight loss.  2. Mild gastroparesis with 30% of contents remaining at two hours.  3. Adenomatous polyps diagnosed on colonoscopy in July of 2008.  4. Large gallstone in the gallbladder.  5. Hemorrhagic cyst on the right kidney.  6. Subpleural nodule in the left lower lobe.  7. Renal insufficiency.   SUBJECTIVE:  [name] is a 77 year old male, who presents as a return  patient visit.  He was initially seen in June 2008 secondary to weight  loss and fullness in his upper abdomen.  Today, he denies any nausea,  vomiting, or abdominal pain.  His appetite is fair.  He is having one  bowel movement a day.  He had questions in regards to his gastric  emptying study and the results of the pathology on his colonoscopy.  His  daughter accompanies him today.   MEDICATIONS:  Lasix 40 mg b.i.d., K-Lor, Lipitor, aspirin, Metformin,  Doxazosin, Azor 10/40 daily, and Colchicine.   OBJECTIVE:  VITAL SIGNS:  Weight 178 pounds (down 5 pounds since June of  2008), height 6 foot 4, temperature 98.4, blood pressure 138/74, pulse  66.GENERAL:  He is in no apparent distress, he is alert and oriented  x4.HEENT:  Atraumatic and normocephalic, pupils are equal and react to  light, mouth no oral lesions, posterior pharynx without erythema or  exudate.NECK:  Full range of motion and no lymphadenopathy.LUNGS:  Clear  to auscultation bilaterally.CARDIOVASCULAR:  Regular rhythm, no murmur,  normal S1 and S2.ABDOMEN:  Bowel sounds are present, soft, nontender,  and nondistended, no rebound or guarding.NEURO:  He has no focal  neurologic deficits.   ASSESSMENT:  [name] is a 77 year old male with abdominal pain and  weight loss.  He has had an extensive workup to include computerized  tomography scans, magnetic resonance imagings,  esophagogastroduodenoscopy, and colonoscopy.  No source for his  abdominal pain and weight loss has been identified.  Thank you for  allowing me to see [name] in consultation.  My recommendations  follow.   RECOMMENDATIONS:  1. [name] has a large gallstone and likely needs laparoscopic      cholecystectomy.  2. He is asked to continue on a gastroparesis high fiber diet.  3. He declined a Nutrition consult to better educate him on a high      calorie diet to prevent any further weight loss.  4. His CT scan showed a left subpleural nodule, which was recommended      to be followed in six months with another CT scan.  5. He has a followup appointment to see me in six weeks to reassess      his weight loss and readdress any abdominal complaints.       Kassie Mends, M.D.  Electronically Signed     SM/MEDQ  D:  06/04/2007  T:  06/04/2007  Job:  [id number]   cc:   Erle Crocker, M.D.

## 2011-03-04 NOTE — Letter (Signed)
February 05, 2009    Dominic Crocker, MD  [address]  [place], [place] [address]   RE:  Dominic, Sandoval  MRN:  [id number]  /  DOB:  10-Jun-1931   Dear Dominic Sandoval:   Dominic Sandoval returns to the office for continued assessment and treatment  of nonischemic cardiomyopathy, prior clinical congestive heart failure,  recent lightheadedness with bradycardia, and multiple additional medical  problems.  Since his last visit, he has done quite well.  Dizziness has  resolved.  He is able to perform a significant amount of work including  yard work without any cardiopulmonary symptoms.  His medications are  unchanged from his last visit except for discontinuation of  spironolactone.   Serial metabolic profiles revealed improvement in renal function from a  creatinine of 2.3-1.77 after discontinuation of spironolactone.   PHYSICAL EXAMINATION:  GENERAL:  Pleasant gentleman in no acute  distress.  VITAL SIGNS:  The weight is 167, unchanged.  Blood pressure 125/65  without orthostatic change.  Heart rate 60 and regular, respirations 12  and unlabored.  NECK:  No jugular venous distention; no carotid bruits.  LUNGS:  Clear.  CARDIAC:  Normal first and second heart sounds; basilar systolic  ejection murmur.  ABDOMEN:  Soft and nontender; no organomegaly.  EXTREMITIES:  No edema.   IMPRESSION:  Dominic Sandoval is substantially improved following  discontinuation of spironolactone.  If he requires further modification  of his medical regime, I would be inclined to decrease his dose of  furosemide.  For the present, we will make no changes.  A chemistry  profile will be reassessed in 3 months.  I will see this nice gentleman  again in 6 months.    Sincerely,      Gerrit Friends. Dietrich Pates, MD, [facility]  Electronically Signed    RMR/MedQ  DD: 02/05/2009  DT: 02/06/2009  Job #: [id number]

## 2011-03-04 NOTE — Assessment & Plan Note (Signed)
NAME:  Dominic Sandoval, Dominic Sandoval                CHART#:  [id number]   DATE:  08/07/2008                       DOB:  04/12/1931   PROBLEM LIST:  1. Normocytic anemia with renal insufficiency.  Creatinine 1.73 in      September 2009, ferritin 250 on oral iron once daily.  2. Unexplained weight loss.  3. Mild gastroparesis with 30% of contents remain in 2 hours.  4. Adenomatous polyp in July 2008.  5. Large gallstone in gallbladder.  6. Hemorrhagic cyst on right kidney.  7. Subpleural nodule in the left lower lobe.  8. History of abdominal pain.   SUBJECTIVE:  The patient is a 77 year old male, who presents for return  patient visit.  He was seen in July 2008 because of his unexplained  weight loss.  His workup included a colonoscopy and a nuclear medicine  scan.  Nuclear medicine scan showed mild gastric emptying delay and the  colonoscopy showed a tubular adenoma.  He also had hyperplastic polyp.  He was seen in followup and given a surgery appointment in August 2008,  but did not see the surgeons.  Dr. Jen Mow is following his left subpleural  nodule via CT scan.  He was seen in our office well for a weight check  and his weight on July 27, 2008, was 166 pounds.  He also has thyroid  check in October and notes 1.230.  He presents today 6 pounds heavier  than he was 11 days ago.   He denies any blood in his stool or vomiting.  He is eating good now.  He denies any abdominal pain.  He has not been using the aspirin, BC or  Goody's Powders, ibuprofen, or Aleve.   MEDICATIONS:  1. Lasix 40 mg b.i.d.  2. Lipitor 20 mg daily.  3. Doxazosin.  4. Amlodipine 10 mg daily.  5. Hydralazine.  6. Diovan.  7. Spironolactone.  8. Iron daily.  9. Aranesp.   OBJECTIVE:  Physical exam:  VITAL SIGNS:  Weight 172 pounds, BMI 20.9 (normal), temperature 98.6,  blood pressure 128/80, pulse 56.  LUNGS:  Clear to auscultation bilaterally.  CARDIOVASCULAR:  Regular rhythm.  ABDOMEN:  Bowel sounds are  present, soft, nontender, nondistended.   ASSESSMENT:  The patient is a 77 year old male, who has normal body  weight.  He has anemia of chronic disease. Thank you for allowing me to  see the patient in consultation.  My recommendations follow.   RECOMMENDATIONS:  1. Follow up in April 2010.  2. He should continue to follow with Dr. Jen Mow.  Would consider      reevaluation if he develops evidence of active GI bleeding.       Kassie Mends, M.D.  Electronically Signed     SM/MEDQ  D:  08/08/2008  T:  08/09/2008  Job:  [id number]   cc:   Erle Crocker, M.D.

## 2011-03-04 NOTE — Assessment & Plan Note (Signed)
NAME:  Dominic Sandoval, Dominic Sandoval                CHART#:  [id number]   DATE:  07/13/2008                       DOB:  1931/08/13   PRIMARY CARE PHYSICIAN:  Erle Crocker, MD.   CHIEF COMPLAINT:  Follow up significant weight loss, dyspepsia,  gastroparesis, and cholelithiasis.   PROBLEM LIST:  1. Mild gastroparesis with 30% contents remained in 2 hours.  2. Adenomatous polyps diagnosed in colonoscopy in July 2008.  3. Large cholelithiasis.  The patient has refused surgical evaluation.  4. Hemorrhagic cyst on right kidney.  5. Subpleural nodule in the left lower lobe of his lung, which has      been stable.  6. Chronic renal insufficiency.  7. Anemia of chronic disease.  8. Dyspepsia and significant weight loss.   SUBJECTIVE:  The patient is a 78 year old African American male.  He has  history as above.  He has lost another 22 pounds in the last year since  he has been seen here.  He tells me his appetite is good.  He is no  longer having early satiety.  His hemoglobin is 11.2.  He has been  receiving Aranesp injections.  He denies any abdominal pain and nausea  or vomiting.  He has refused to have surgical consult for potential  laparoscopic cholecystectomy for large cholelithiasis.  He denies  diarrhea and constipation.  He tells me he is eating full meals for  breakfast, lunch, dinner, and snacks in between.   CURRENT MEDICATIONS:  Lasix 40 mg daily, Lipitor 40 mg daily, aspirin 81  mg daily, doxazosin 4 mg nightly, amlodipine 10 mg daily, hydralazine 50  mg t.i.d., Diovan 160 mg daily, spironolactone 25 mg daily, ferrous  sulfate 325 mg daily, and Aranesp 75 mcg injections   ALLERGIES:  No known drug allergies.   OBJECTIVE:  VITAL SIGNS:  Weight 167 pounds, height 76 inches,  temperature 98.4, blood pressure 150/78, and pulse 56.  GENERAL:  The patient is a tall and thin African American male who is  alert, oriented, pleasant, and cooperative, in no acute distress.  HEENT:  Sclerae  clear and nonicteric.  Conjunctivae pink.  Oropharynx  pink and moist without any lesions.  CHEST:  Heart regular rate and rhythm.  Normal S1 and S2.ABDOMEN:  Positive bowel sounds x4.  No bruits auscultated.  Soft, nontender, and  nondistended without palpable mass or hepatosplenomegaly.  No rebound,  tenderness, or guarding.  EXTREMITIES:  Without clubbing or edema.   ASSESSMENT:  The patient is a 77 year old African American male with  history of dyspepsia, which has resolved; mild gastroparesis, untreated  this time; and significant weight loss.  He is known to have a large  gallstone in his gallbladder and has stable left subpleural nodule.  He  is not having an abdominal pain.  He has had benign EGD and colonoscopy  previously.  I have nothing to attribute his significant weight loss.  He also has anemia of chronic disease, renal insufficiency, and chronic  kidney disease.   PLAN:  1. He will return in 2 weeks for weight check.  2. He will bring in a calorie count at that time.  3. We will request recent lab work from Dr. Virgilio Belling office.  If he has      not had a recent TSH and CBC, this will need  to be done.       Lorenza Burton, N.P.  Electronically Signed     Kassie Mends, M.D.  Electronically Signed    KJ/MEDQ  D:  07/13/2008  T:  07/13/2008  Job:  [id number]   cc:   Erle Crocker, M.D.

## 2011-03-04 NOTE — Assessment & Plan Note (Signed)
[facility]                       [place] CARDIOLOGY OFFICE NOTE   Dominic Sandoval, Dominic Sandoval                      MRN:          [id number]  DATE:01/13/2008                            DOB:          1931-09-01    CARDIOLOGY OUTPATIENT CATHETERIZATION NOTE   REFERRING PHYSICIAN:  Erle Crocker, M.D.   PRIMARY CARDIOLOGIST:  Gerrit Friends. Dietrich Pates, MD, [facility]   HISTORY OF PRESENT ILLNESS:  77 year old gentleman with  recently diagnosed congestive heart failure and moderate left  ventricular dysfunction.  Dominic Sandoval presented with progressive dyspnea  on exertion and peripheral edema.  He was found to have moderately  impaired left ventricular systolic function with an ejection fraction  .35-.40.  Mild to moderate LVH was present with severe inferior wall  hypokinesis.  There was also moderate mitral regurgitation and a  moderate elevation in estimated right-sided pressures.  He has done  extremely well with initial medical therapy.   CURRENT MEDICATIONS:  1. Doxazosin 4 mg daily.  2. Atorvastatin 40 mg daily.  3. Hydralazine 50 mg t.i.d.  4. Diovan 160 mg daily.  5. Spironolactone 25 mg daily.  6. Furosemide 80 mg daily.   Cardiovascular risk factors include dyslipidemia, hypertension and a  history of diabetes.  He no longer requires pharmacologic therapy for  elevation in lipids or in blood glucose.   PAST MEDICAL HISTORY:  Notable for weight loss for which GI evaluation  was recently undertaken.  He has had anemia and thrombocytopenia, and  has been followed by Dr. Mariel Sleet.  He underwent surgical resection of  a carcinoma of the colon in 1990.  He received external beam radiation  therapy for carcinoma of the prostate in approximately 2000.  He has a  remote history of 40 pack years of cigarette smoking.  There is history  of gout, cholelithiasis and chronic kidney disease.  Creatinine most  recently was 1.8.   SOCIAL HISTORY:   Widower; does a fair amount of walking without  difficulty.   FAMILY HISTORY:  Positive for neoplastic disease, but no cardiac issues.   REVIEW OF SYSTEMS:  Notable for cataracts, occasional palpitations, some  chronic GI symptoms.  All other systems reviewed and are negative.   EXAMINATION:  On exam, pleasant, trim gentleman in no acute distress.  The weight is 175, 21 pounds less than two weeks ago.  Blood pressure  130/50, heart rate 66 and regular.  Respirations 14.  HEENT:  Anicteric sclerae; normal oral mucosa.  NECK:  No jugular venous distention; no carotid bruits.  LUNGS:  Clear.  CARDIAC:  Normal first and second heart sounds; modest nonspecific  murmur at the left sternal border.  ABDOMEN:  Soft and nontender.  No masses, no organomegaly.  EXTREMITIES:  Trace edema; distal pulses intact.   IMPRESSION:  Dominic Sandoval is doing quite well with medical therapy.  His  murmur and mitral regurgitation is less impressive.  His pulmonary  hypertension may have resolved with appropriate therapy, particularly  with a 20 pound diuresis.  Nonetheless, it would be valuable to proceed  with cardiac catheterization to evaluate these issues as  well as to rule  out significant coronary disease.  The  risks and benefits were  explained to Dominic Sandoval who agrees to proceed.  We will arrange for that  study to be completed and the results to be sent to you.   Thanks so much for allowing me to assist with Dominic Sandoval's care.     Gerrit Friends. Dietrich Pates, MD, [facility]  Electronically Signed    RMR/MedQ  DD: 01/13/2008  DT: 01/13/2008  Job #: [id number]   cc:   Erle Crocker, M.D.

## 2011-03-04 NOTE — Assessment & Plan Note (Signed)
[facility]                       [place] CARDIOLOGY OFFICE NOTE   ANDREU, DRUDGE                      MRN:          [id number]  DATE:01/25/2009                            DOB:          1931/09/16    CARDIOLOGIST:  Gerrit Friends. Dietrich Pates, MD, [facility]   PRIMARY CARE PHYSICIAN:  Erle Crocker, MD   REASON FOR VISIT:  Dyspnea and bradycardia.   HISTORY OF PRESENT ILLNESS:  [name] is a 77 year old male patient  with a history of nonischemic cardiomyopathy with previous EF of 35-40%.  He underwent cardiac catheterization in April 2009 that demonstrated  noncritical CAD.  His worst lesion was a 50% stenosis in the mid LAD.  He has been treated for his cardiomyopathy.  No beta-blockers have been  used secondary to bradycardia.  He had a followup echocardiogram in July  2009 demonstrated normalization of his LV function.  He has done well up  until the last couple of weeks.  He had begun to note some dyspnea with  exertion.  This is not occurring with all of his activity.  There are  times where he can exert himself without symptoms.  He does feel near  syncopal.  He denies any near syncope while sitting at rest.  He denies  any chest discomfort, arm, or jaw discomfort.  He denies any frank  syncope.  He denies orthopnea, PND, or pedal edema.  He denies any  weight gain.  He denies nausea or diaphoresis.   CURRENT MEDICATIONS:  1. Doxazosin 8 mg nightly.  2. Lipitor 40 mg daily.  3. Hydralazine 50 mg 3 times a day.  4. Diovan 160 mg daily.  5. Spirolactone 25 mg daily.  6. Lasix 80 mg daily.  7. Imdur 60 mg daily.  8. Ferrous sulfate 325 mg daily.  9. Aranesp q. month.   ALLERGIES:  No known drug allergies.   SOCIAL HISTORY:  He is a nonsmoker.   REVIEW OF SYSTEMS:  Please see HPI.  Denies fevers, chills, cough,  melena, hematochezia, hematuria, or dysuria.  All other systems reviewed  and negative.   PHYSICAL EXAMINATION:  GENERAL:  He  is a well-nourished, well-developed  elderly male in no acute distress.  VITAL SIGNS:  Blood pressure is 130/60, pulse 53, and weight 166 pounds.  This is down 2 pounds since last visit.  HEENT:  Normal.  NECK:  Without JVD.  Carotids are without bruits bilaterally.  CARDIAC:  Normal S1 and S2.  Regular rate and rhythm.  LUNGS:  Clear to auscultation bilaterally.  No rales.  No wheezing.  ABDOMEN:  Soft and nontender.  EXTREMITIES:  With trace ankle edema bilaterally.  NEUROLOGIC:  He is alert and oriented x3.  Cranial nerves II-XII are  grossly intact.  SKIN:  Warm and dry.   Electrocardiogram reveals sinus bradycardia with a heart rate of 53,  normal axis, no ischemic changes.  Orthostatic vital signs, Blood  pressure lying 120/65 with a pulse of 50, sitting 125/60 with pulse of  56, standing 118/60 with pulse 62, after 10 minutes121/60 with a pulse  of 66, and  at 5 minutes 120/60 with a pulse of 65.   6-minute walk test.  The patient ambulated for 6 minutes and covered a  distance of 900 feet.  His rhythm strip at the end of exercise  demonstrate a heart rate of 52.   ASSESSMENT AND PLAN:  1. Dyspnea and near syncope.  The patient does exhibit significant      bradycardia on exam today.  His walk test does hint towards      chronotropic incompetence.  I discussed this case further with Dr.      Daleen Squibb.  Since he only walked 900 feet and could note achieve a heart      rate of greater than 50 with walking, we have decided to hold off      on proceeding with an exercise treadmill test.  He last underwent      cardiac catheterization 1 year ago.  He is not having any chest      pain.  We do not think he needs a nuclear study at this time.  He      will be set up for a 48-hour Holter monitor to assess for      significant bradycardia.  TSH will be added to his blood work.  He      will be brought back in followup with Dr. Dietrich Pates next week to      review the findings on his Holter  and make further recommendations,      if any.  2. Chronic kidney disease.  He had recent lab work drawn by Dr. Jen Mow      yesterday.  Potassium is 4.1, but his creatinine has gone up to      2.3.  His BUN is 38.  His spirolactone will be held and he will      have a followup BMET next week prior to his follow up visit.  3. Anemia.  He is followed by Dr. Mariel Sleet.  I believe he has anemia      of chronic disease.  Recent blood work drawn by Dr. Jen Mow yesterday      demonstrates a stable hemoglobin at 10.9.  4. Hypertension, this is well controlled.  5. Dyslipidemia.  He is on Lipitor and this is followed by Dr. Jen Mow.   DISPOSITION:  He will be brought back in followup with Dr. Dietrich Pates next  week to review the findings on his Holter monitor and to make further  recommendations, if any.      Tereso Newcomer, PA-C  Electronically Signed      Jesse Sans. Daleen Squibb, MD, [facility]  Electronically Signed   SW/MedQ  DD: 01/25/2009  DT: 01/26/2009  Job #: [id number]

## 2011-03-04 NOTE — Letter (Signed)
December 24, 2007    Erle Crocker, M.D.  [address]  [place], [place] [address]   RE:  DAK, SZUMSKI  MRN:  [id number]  /  DOB:  06-07-1931   Dear Wynona Canes:   It was my pleasure evaluating Mr. Palomarez in consultation today in the  office at your request.  As you know, this nice gentleman has had a host  of medical problems, but more recently has complained of exertional  dyspnea and PND. He has had increasing pedal edema.  He has not had any  substantial weight gain, but has had a progressive weight loss over the  past year or so, totaling approximately 60 pounds. A recent chest x-ray  showed mild pulmonary edema with pleural effusions and a possible  pleural based mass.  CT scan of the chest ruled out any mass lesions;  only a small nodule was seen in the right upper lobe, which was stable.  Mr. Sassano has no significant cardiac history.  He was seen by a  cardiologist approximately 30 years ago for cardiomegaly.  He dates the  onset of dyspnea to a month or two ago.  He has had no chest discomfort.  He has multiple cardiovascular risk factors including a history of  dyslipidemia, hypertension and diabetes; however, he no longer requires  pharmacologic therapy for lipids nor for elevated blood glucose.  He has  had no fever, cough nor sputum production.  He has had impressive edema,  extending well above the knee on presentation.  Venous studies of the  legs ruled out DVT.  He has been treated with antibiotics for chest  congestion.  He has received some injections in the office, which may  have been intravenous furosemide.  He just started on oral furosemide at  a dose of 20 mg daily within the past 24 hours.  He is vague concerning  his medications and his medical history.  His daughter is present, who  is quite sharp.   HEMATOLOGIST:  Ladona Horns. Neijstrom, MD.   CURRENT MEDICATIONS:  1. Atorvastatin 20 mg daily.  2. Aspirin 81 mg daily.  3. Doxazosin 8 mg  q.h.s.  4. Amlodipine 10 mg daily.  5. Colchicine 6 mg daily.  6. Doxycycline 100 mg b.i.d.  7. Furosemide 20 mg daily.  8. Diovan 80 mg daily.  9. Kay Ciel 8 mEq daily.   There are no known drug allergies.   PAST MEDICAL HISTORY:  Extensive.  He was evaluated by Dr. Cira Servant late  last year for weight loss.  Upper and lower endoscopy revealed only a  benign appearing colonic polyp that was resected.  He has a single large  gallstone.  He has had anemia, most recently with a hemoglobin of 9.2  and a normal MCV.  He has also had mild thrombocytopenia for which no  etiology was identified by Dr. Mariel Sleet. He has had gout in the past.  He has chronic kidney disease; creatinine was most recently 1.9.   He required resection of a carcinoma of the colon in approximately 1990.  He received external beam radiation therapy approximately 8 years ago  for carcinoma of the prostate.  A recent PSA was normal.   He smoked cigarettes until 8 years ago with total consumption of  approximately 40 pack-years.  He denies excessive use of alcohol.  He is  retired and is a widower.  He has one daughter who lives in the area.   FAMILY HISTORY:  Positive for neoplastic disease; no prominent cardiac  disease.   REVIEW OF SYSTEMS:  Notable for previous cataract surgery, occasional  palpitations, intermittent nausea, emesis, diarrhea and constipation.  All other systems reviewed and are negative.   PHYSICAL EXAMINATION:  A lanky, pleasant gentleman in no acute distress.  The weight is 199 pounds.  Blood pressure 160/80 in the right arm, heart  rate 60 and irregular, respirations 18.  NECK:  No jugular venous distention; normal carotid upstrokes without  bruits.  HEENT:  EOMs full; anicteric sclerae; normal lids and conjunctivae;  normal oral mucosa.  LUNGS:  Decreased breath sounds at both bases with dullness to  percussion, more prominent on the left.  CARDIAC:  Normal first and second heart sounds;  grade 2/6 holosystolic  murmur at the left sternal border and apex; PMI is slightly displaced  inferiorly.  ABDOMEN:  Soft and nontender; no hepatomegaly; normal bowel sounds.  EXTREMITIES:  2 to  3+ edema to the knees; skin integrity is maintained.  NEUROLOGIC:  Normal cranial nerves; symmetric strength and tone.   EKG:  Sinus bradycardia; delayed R-wave progression; LVH with  repolarization abnormality; leftward axis; QT prolongation.  No prior  tracing for comparison.   Echocardiogram obtained a few weeks ago shows moderate left atrial  enlargement; mild to moderate LVH; severe hypokinesis of the inferior  wall and moderately impaired overall LV systolic function.  Estimated  ejection fraction was 0.35 - 0.40.   IMPRESSION:  Mr. Siedlecki has congestive heart failure with moderate left  ventricular dysfunction. He probably has components of both systolic and  diastolic CHF.  He will require substantial diuresis.  With baseline  impaired renal function, a dose of 20 mg per day of furosemide will  probably not be adequate. We will increase this to 60 mg for now and  monitor him closely.  He is advised to weigh himself at home and to call  for any weight gain or failure to lose weight.  His daughter is worried  about his ability to report his symptoms.  For any persistent or  progressive dyspnea, 911 should be summoned.  Sinus bradycardia will  preclude treatment with beta blocker.  He is receiving an ARB.  We will  have to assess renal function before deciding what other treatment will  be appropriate.   The etiology of his congestive heart failure is unclear.  With a  segmental wall motion abnormality, coronary disease is the most likely  possibility.  He will ultimately require cardiac catheterization or at  least stress testing.   Blood pressure control will need to be optimal.  Lipids will be  assessed.  A BNP level will be obtained.   For now, I will follow this gentleman  closely and plan to see him again  in 1 week.  A chemistry profile will be repeated in 3 days.    Sincerely,      Gerrit Friends. Dietrich Pates, MD, [facility]  Electronically Signed    RMR/MedQ  DD: 12/23/2007  DT: 12/24/2007  Job #: [id number]

## 2011-03-04 NOTE — Letter (Signed)
May 04, 2008    Hillery Aldo, MD  [facility]  [address] [place]  [place], [place]   RE:  MCCARTNEY, CHUBA  MRN:  [id number]  /  DOB:  [birth year]   Dear Wynona Canes,   Mr. Hidrogo returns to the office for continued assessment and treatment  of cardiomyopathy that is out of proportion to his degree of coronary  artery disease, congestive heart failure, and cardiovascular risk  factors.  Since his last visit, he has done extremely well.  He notes an  improved sense of well being.  He has been eating well, but still losing  some weight.  He notes no dyspnea nor chest discomfort.  He has had no  edema.   Medications are unchanged from his last visit.   PHYSICAL EXAMINATION:  GENERAL:  Pleasant, tall, and thin gentleman in  no acute distress.  VITAL SIGNS:  Weight is 168, 5 pounds less than in April 2009.  Blood  pressure 125/60, heart rate 70 and regular, and respirations 14.  NECK:  No jugular venous distention; normal carotid upstrokes without  bruits.  LUNGS:  Clear.  CARDIAC:  Normal first and second heart sounds; grade 2/6 buzzing early  peaking systolic ejection murmur that does not radiate to the carotids.  ABDOMEN:  Soft and nontender; no masses; no organomegaly.  EXTREMITIES:  Benign.  Cardiac catheterization site in the right  inguinal region with bounding femoral pulses; no distal edema.   IMPRESSION:  Mr. Dungan is doing extremely well with current medical  therapy.  We will reassess left ventricular systolic function with  echocardiography to determine if he should be offered an automatic  implantable cardioverter-defibrillator.  A chemistry profile and CBC  will be obtained.  If results are good, I will plan to see this nice  gentleman again in 6 months.   Thanks so much for allowing me to share in his care.     Sincerely,      Gerrit Friends. Dietrich Pates, MD, [facility]  Electronically Signed    RMR/MedQ  DD: 05/04/2008  DT: 05/05/2008  Job #:  [id number]

## 2011-03-04 NOTE — Letter (Signed)
December 31, 2007    Erle Crocker, M.D.  [address]  [place], [place]  [address]   RE:  Dominic Sandoval, Dominic Sandoval  MRN:  [id number]  /  DOB:  Jun 27, 1931   Dear Wynona Canes:   [name] returns to the office for continued close followup of the new  onset of congestive heart failure.  Since his last visit, he feels a  good deal better.  He has noted decrease edema, increased energy, and  decreased dyspnea on exertion.  He has monitored his weight at home, and  the decrease is only 3 pounds.  He is tolerating his current medication  well.  This includes aspirin 81 mg daily, doxazosin 8 mg daily, Diovan  80 mg daily, KCl 8 mEq daily, furosemide 60 mg daily, atorvastatin 20 mg  daily.   PHYSICAL EXAMINATION:  GENERAL:  Pleasant, trim gentleman in no acute  distress.  VITAL SIGNS:  The weight is 196, 3 pounds less than last week.  Blood  pressure 145/75, heart rate 60 and regular, respirations 18.  NECK:  Minimal jugular venous distention; HJR present.  LUNGS:  Clear.  CARDIAC:  Normal first heart sounds; some increased intensity of the  second heart sounds; grade 2/6 holosystolic apical murmur.  ABDOMEN:  Soft and nontender; no organomegaly.  EXTREMITIES:  1- 2+ pitting pretibial edema.   LABORATORY DATA:  Chemistry profile was good; creatinine improved from  1.9-1.6.  Lipid profile is excellent.  Hemoglobin A1c is normal.  BNP  level is 1403.   IMPRESSION:  [name] is doing well with initial medical therapy of  congestive heart failure.  We will increase Diovan 160 mg daily,  discontinue potassium, increase furosemide to 80 mg daily, and add  nitrates and hydralazine.  Due to uncertainty about the etiology of left  ventricular  dysfunction, the severity of mitral regurgitation, and the cause of his  significant pulmonary hypertension, cardiac catheterization will  ultimately be necessary.  I would like to get him in the best shape  possible, and we will continue this regimen  for two more weeks at which  time he will be reevaluated.  A chemistry profile will be obtained in 1  week.    Sincerely,      Gerrit Friends. Dietrich Pates, MD, [facility]  Electronically Signed    RMR/MedQ  DD: 12/31/2007  DT: 01/01/2008  Job #: [id number]

## 2011-03-26 ENCOUNTER — Ambulatory Visit: Payer: MEDICARE | Admitting: Gastroenterology

## 2011-07-11 LAB — COMPREHENSIVE METABOLIC PANEL
Alkaline Phosphatase: 89
BUN: 15
Chloride: 107
Glucose, Bld: 124 — ABNORMAL HIGH
Potassium: 3.1 — ABNORMAL LOW
Total Bilirubin: 0.8

## 2011-07-11 LAB — VITAMIN B12: Vitamin B-12: 453 (ref 211–911)

## 2011-07-11 LAB — DIFFERENTIAL
Monocytes Absolute: 0.3
Neutro Abs: 3.1
Neutrophils Relative %: 67

## 2011-07-11 LAB — PSA: PSA: 1.6

## 2011-07-11 LAB — CBC
HCT: 27.6 — ABNORMAL LOW
Hemoglobin: 9.2 — ABNORMAL LOW
WBC: 4.6

## 2011-07-15 LAB — POCT I-STAT 3, VENOUS BLOOD GAS (G3P V)
Acid-Base Excess: 4 — ABNORMAL HIGH
Bicarbonate: 29.2 — ABNORMAL HIGH
Bicarbonate: 29.3 — ABNORMAL HIGH
Bicarbonate: 29.6 — ABNORMAL HIGH
O2 Saturation: 70
O2 Saturation: 70
Operator id: 141321
Operator id: 141321
TCO2: 27
TCO2: 31
TCO2: 31
pCO2, Ven: 46.4
pCO2, Ven: 47.1
pH, Ven: 7.337 — ABNORMAL HIGH
pH, Ven: 7.411 — ABNORMAL HIGH
pO2, Ven: 34
pO2, Ven: 36
pO2, Ven: 37
pO2, Ven: 37

## 2011-07-15 LAB — POCT I-STAT 3, ART BLOOD GAS (G3+)
Bicarbonate: 30.8 — ABNORMAL HIGH
TCO2: 32
pCO2 arterial: 43.4
pH, Arterial: 7.459 — ABNORMAL HIGH

## 2011-08-18 ENCOUNTER — Other Ambulatory Visit (HOSPITAL_COMMUNITY): Payer: Self-pay | Admitting: Internal Medicine

## 2011-08-18 DIAGNOSIS — R413 Other amnesia: Secondary | ICD-10-CM

## 2011-08-19 ENCOUNTER — Other Ambulatory Visit (HOSPITAL_COMMUNITY): Payer: Self-pay | Admitting: Internal Medicine

## 2011-08-19 DIAGNOSIS — R413 Other amnesia: Secondary | ICD-10-CM

## 2011-08-20 ENCOUNTER — Ambulatory Visit (HOSPITAL_COMMUNITY): Payer: Medicare Other

## 2011-08-20 ENCOUNTER — Ambulatory Visit (HOSPITAL_COMMUNITY)
Admission: RE | Admit: 2011-08-20 | Discharge: 2011-08-20 | Disposition: A | Discharge status: Home / Self Care | Payer: Medicare Other | Source: Ambulatory Visit | Attending: Internal Medicine | Admitting: Internal Medicine

## 2011-08-20 DIAGNOSIS — R413 Other amnesia: Secondary | ICD-10-CM | POA: Insufficient documentation

## 2011-08-20 DIAGNOSIS — Z85038 Personal history of other malignant neoplasm of large intestine: Secondary | ICD-10-CM | POA: Insufficient documentation

## 2011-08-20 DIAGNOSIS — I672 Cerebral atherosclerosis: Secondary | ICD-10-CM | POA: Insufficient documentation

## 2011-12-18 ENCOUNTER — Other Ambulatory Visit: Payer: Self-pay | Admitting: Cardiology

## 2012-01-19 ENCOUNTER — Other Ambulatory Visit: Payer: Self-pay | Admitting: Cardiology

## 2012-02-10 ENCOUNTER — Encounter: Payer: Self-pay | Admitting: Cardiology

## 2012-02-10 DIAGNOSIS — C61 Malignant neoplasm of prostate: Secondary | ICD-10-CM | POA: Insufficient documentation

## 2012-02-10 DIAGNOSIS — E785 Hyperlipidemia, unspecified: Secondary | ICD-10-CM | POA: Insufficient documentation

## 2012-02-10 DIAGNOSIS — I2510 Atherosclerotic heart disease of native coronary artery without angina pectoris: Secondary | ICD-10-CM | POA: Insufficient documentation

## 2012-02-10 DIAGNOSIS — R634 Abnormal weight loss: Secondary | ICD-10-CM | POA: Insufficient documentation

## 2012-02-10 DIAGNOSIS — N183 Chronic kidney disease, stage 3 unspecified: Secondary | ICD-10-CM | POA: Insufficient documentation

## 2012-02-10 DIAGNOSIS — D649 Anemia, unspecified: Secondary | ICD-10-CM | POA: Insufficient documentation

## 2012-02-10 DIAGNOSIS — I10 Essential (primary) hypertension: Secondary | ICD-10-CM | POA: Insufficient documentation

## 2012-02-10 DIAGNOSIS — I495 Sick sinus syndrome: Secondary | ICD-10-CM | POA: Insufficient documentation

## 2012-02-11 ENCOUNTER — Ambulatory Visit: Payer: Medicare Other | Admitting: Cardiology

## 2012-02-20 ENCOUNTER — Other Ambulatory Visit: Payer: Self-pay | Admitting: Cardiology

## 2012-03-25 ENCOUNTER — Other Ambulatory Visit: Payer: Self-pay | Admitting: Cardiology

## 2012-03-29 ENCOUNTER — Encounter: Payer: Self-pay | Admitting: Gastroenterology

## 2012-04-21 ENCOUNTER — Other Ambulatory Visit: Payer: Self-pay | Admitting: Cardiology

## 2012-04-26 ENCOUNTER — Other Ambulatory Visit: Payer: Self-pay | Admitting: Cardiology

## 2012-04-26 NOTE — Telephone Encounter (Signed)
Patient has scheduled f/u appt for May 20, 2012. / tg

## 2012-04-28 ENCOUNTER — Other Ambulatory Visit: Payer: Self-pay | Admitting: *Deleted

## 2012-04-28 MED ORDER — AMLODIPINE BESYLATE 10 MG PO TABS: 10.0000 mg | ORAL_TABLET | Freq: Every day | ORAL | Status: DC

## 2012-04-29 ENCOUNTER — Encounter: Payer: Self-pay | Admitting: Gastroenterology

## 2012-05-20 ENCOUNTER — Ambulatory Visit: Payer: Medicare Other | Admitting: Cardiology

## 2012-06-02 ENCOUNTER — Ambulatory Visit: Payer: Medicare Other | Admitting: Gastroenterology

## 2012-06-09 ENCOUNTER — Encounter: Payer: Self-pay | Admitting: Gastroenterology

## 2012-06-10 ENCOUNTER — Other Ambulatory Visit: Payer: Self-pay | Admitting: Gastroenterology

## 2012-06-10 ENCOUNTER — Encounter: Payer: Self-pay | Admitting: Gastroenterology

## 2012-06-10 ENCOUNTER — Ambulatory Visit (INDEPENDENT_AMBULATORY_CARE_PROVIDER_SITE_OTHER): Payer: Medicare Other | Admitting: Gastroenterology

## 2012-06-10 ENCOUNTER — Other Ambulatory Visit: Payer: Self-pay

## 2012-06-10 VITALS — BP 145/75 | HR 50 | Temp 98.0°F | Ht 74.0 in | Wt 154.0 lb

## 2012-06-10 DIAGNOSIS — D649 Anemia, unspecified: Secondary | ICD-10-CM

## 2012-06-10 LAB — CBC WITH DIFFERENTIAL/PLATELET
Eosinophils Absolute: 0.1 10*3/uL (ref 0.0–0.7)
HCT: 30.4 % — ABNORMAL LOW (ref 39.0–52.0)
Hemoglobin: 9.8 g/dL — ABNORMAL LOW (ref 13.0–17.0)
Lymphs Abs: 0.9 10*3/uL (ref 0.7–4.0)
MCH: 27.5 pg (ref 26.0–34.0)
MCV: 85.4 fL (ref 78.0–100.0)
Monocytes Relative: 7 % (ref 3–12)
Neutrophils Relative %: 66 % (ref 43–77)
RBC: 3.56 MIL/uL — ABNORMAL LOW (ref 4.22–5.81)

## 2012-06-10 NOTE — Assessment & Plan Note (Signed)
LAST BLOOD DRAW MAY 2012.   CBC/FERRITIN TODAY. CONSIDER UPPER ENDOSCOPY IF ANEMIA WORSE OR IRON LOW. OPV IN 1 YEAR.

## 2012-06-10 NOTE — Progress Notes (Signed)
Subjective:    Patient ID: Dominic Sandoval, male    DOB: 1931-05-29, 78 y.o.   MRN: [id number]  Pcp: FANTA  HPI No questions or concerns. PT DENIES FEVER, CHILLS, BRBPR, nausea, vomiting, melena, diarrhea, constipation, abd pain, problems swallowing, heartburn or indigestion. STILL DRIVING. GOOD APPETITE. NO BLOOD COUNTS SINCE LAST YEAR. BM BOUT EVERY DAY.  Past Medical History  Diagnosis Date  . Arteriosclerotic cardiovascular disease (ASCVD)     nonobstructive; 50% LAD in 12/2007, mild pulmonary hypertension, elevated PCW; 7/09-normal EF;   h/o CHF in 2/09 with EF of 35-40% on echo; EF of 35-40% in early 2009, but nl in 7/09; 3/09-mild to mod. CAD; 50% LAD; 30% RCA/CX  . Sinus node dysfunction     has not tolerated beta blocker; rate as low as 34 on Holter in 2010; asymptomatic  . Prostate cancer 2004    h/o benign prostatic hypertrophy; treated with external RT in 2004  . Diabetes mellitus, type 2   . Gastroparesis     Mild, 30% retained contents at 2 hours  . Normocytic anemia     attributed to chronic kidney disease  . Cholelithiasis   . Colonic adenoma 2008comment 2011    TCS and polypectomy 2008, 2011 with Hemoccult positive stool  . Hyperlipidemia   . Hypertension   . Colon cancer 1990    right hemicolectomy  . Chronic kidney disease     Cr-1.73 in 06/2008;  1.3 in 07/2010  . Gout   . Pulmonary nodule 10/2007    Left lower lobe, 5 mm, subpleural 10/2007    Past Surgical History  Procedure Date  . Hemicolectomy     ? RIGHT CARCINOMA OF THE COLON  . Tcs 2008  . Polypectomy 2008  . Esophagogastroduodenoscopy 04/19/2007    Normal esophagus without evidence of Barrett's,/ Normal stomach without evidence of erosions/ Normal duodenal  . Colonoscopy  05/04/2007    A 6 mm transverse colon polyp removed/A 3 mm transverse colon polyp removed /  An 8 mm sessile sigmoid colon polyp removed   . Colonoscopy 07/23/2010    adenomas/internal hemorrhoids    No Known  Allergies  Current Outpatient Prescriptions  Medication Sig Dispense Refill  . amLODipine (NORVASC) 10 MG tablet Take 1 tablet (10 mg total) by mouth daily.    Marland Kitchen donepezil (ARICEPT) 10 MG tablet Take 10 mg by mouth at bedtime.     Marland Kitchen doxazosin (CARDURA) 8 MG tablet take 1 tablet by mouth at bedtime    . lisinopril (PRINIVIL,ZESTRIL) 20 MG tablet Take 20 mg by mouth daily.      . simvastatin (ZOCOR) 20 MG tablet Take 20 mg by mouth at bedtime.             Review of Systems     Objective:   Physical Exam  Vitals reviewed. Constitutional: He is oriented to person, place, and time. He appears well-nourished. No distress.  HENT:  Head: Atraumatic.  Mouth/Throat: Oropharynx is clear and moist. No oropharyngeal exudate.  Eyes: Pupils are equal, round, and reactive to light. No scleral icterus.  Neck: Normal range of motion. Neck supple.  Cardiovascular: Normal rate, regular rhythm and normal heart sounds.   Pulmonary/Chest: Effort normal and breath sounds normal. No respiratory distress.  Abdominal: Soft. Bowel sounds are normal. He exhibits no distension. There is no tenderness.  Musculoskeletal: He exhibits no edema.  Neurological: He is alert and oriented to person, place, and time.       NO  FOCAL DEFICITS   Psychiatric: He has a normal mood and affect.          Assessment & Plan:

## 2012-06-10 NOTE — Progress Notes (Signed)
Faxed to PCP

## 2012-06-10 NOTE — Patient Instructions (Signed)
GET YOUR LABS DRAWN TODAY. I AM CHECKING YOUR BLOOD COUNT AND IRON STORES. I WILL CALL YOU WITH YOUR RESULTS.  IF YOU HAVE LOW BLOOD COUNT OR LOW IRON YOU MAY NEED AN UPPER ENDOSCOPY.  FOLLOW UP IN ONE YEAR.

## 2012-06-11 LAB — FERRITIN: Ferritin: 327 ng/mL — ABNORMAL HIGH (ref 22–322)

## 2012-06-14 ENCOUNTER — Telehealth: Payer: Self-pay | Admitting: Gastroenterology

## 2012-06-14 NOTE — Telephone Encounter (Signed)
PLEASE CALL PT.  HIS BLOOD COUNT REMAINS AROUND 10. HIS IRON STORES ARE NORMAL. HIS PLATELET COUNT IS LOW. HE SHOULD SEE A BLOOD SPECIALIST: DR. Mariel Sleet TO SEE IF HE NEEDS HIS BONE MARROW EVALUATED. HE ALSO NEEDS AN EGD DX: ANEMIA.

## 2012-06-15 ENCOUNTER — Encounter: Payer: Self-pay | Admitting: Cardiology

## 2012-06-15 ENCOUNTER — Ambulatory Visit (INDEPENDENT_AMBULATORY_CARE_PROVIDER_SITE_OTHER): Payer: Medicare Other | Admitting: Cardiology

## 2012-06-15 ENCOUNTER — Other Ambulatory Visit: Payer: Self-pay | Admitting: Gastroenterology

## 2012-06-15 VITALS — BP 142/76 | HR 58 | Ht 74.0 in | Wt 157.0 lb

## 2012-06-15 DIAGNOSIS — C61 Malignant neoplasm of prostate: Secondary | ICD-10-CM

## 2012-06-15 DIAGNOSIS — I2510 Atherosclerotic heart disease of native coronary artery without angina pectoris: Secondary | ICD-10-CM

## 2012-06-15 DIAGNOSIS — E119 Type 2 diabetes mellitus without complications: Secondary | ICD-10-CM

## 2012-06-15 DIAGNOSIS — I10 Essential (primary) hypertension: Secondary | ICD-10-CM

## 2012-06-15 DIAGNOSIS — E785 Hyperlipidemia, unspecified: Secondary | ICD-10-CM

## 2012-06-15 DIAGNOSIS — M109 Gout, unspecified: Secondary | ICD-10-CM

## 2012-06-15 DIAGNOSIS — E782 Mixed hyperlipidemia: Secondary | ICD-10-CM

## 2012-06-15 DIAGNOSIS — R634 Abnormal weight loss: Secondary | ICD-10-CM

## 2012-06-15 DIAGNOSIS — C189 Malignant neoplasm of colon, unspecified: Secondary | ICD-10-CM | POA: Insufficient documentation

## 2012-06-15 DIAGNOSIS — D649 Anemia, unspecified: Secondary | ICD-10-CM

## 2012-06-15 DIAGNOSIS — I495 Sick sinus syndrome: Secondary | ICD-10-CM

## 2012-06-15 DIAGNOSIS — N189 Chronic kidney disease, unspecified: Secondary | ICD-10-CM

## 2012-06-15 DIAGNOSIS — I7090 Unspecified atherosclerosis: Secondary | ICD-10-CM

## 2012-06-15 NOTE — Assessment & Plan Note (Signed)
Chronic anemia not likely due to iron deficiency or nutritional issues.  Discussed with Dr. Felecia Shelling who suggested a referral to Hematology.

## 2012-06-15 NOTE — Telephone Encounter (Signed)
REVIEWED.  

## 2012-06-15 NOTE — Telephone Encounter (Signed)
Dominic Sandoval is scheduled with [facility] on Sept 10th in Room 2 instructions has been mailed to the patient and a referral has been sent to Dr. Mariel Sleet as well

## 2012-06-15 NOTE — Patient Instructions (Addendum)
Your physician recommends that you schedule a follow-up appointment in: 1 year  Appt with Dr Mariel Sleet to manage anemia  Your physician recommends that you return for lab work in: Within the week

## 2012-06-15 NOTE — Assessment & Plan Note (Signed)
No symptoms to suggest myocardial ischemia or CHF.  We will continue to manage cardiovascular risk factors.

## 2012-06-15 NOTE — Assessment & Plan Note (Signed)
Repeat assessment of renal function will be obtained in conjunction with a metabolic profile.

## 2012-06-15 NOTE — Assessment & Plan Note (Signed)
No apparent symptomatic arrhythmias at present.

## 2012-06-15 NOTE — Assessment & Plan Note (Signed)
Blood pressure control is good with current medication, which will be continued. 

## 2012-06-15 NOTE — Progress Notes (Signed)
Patient ID: Dominic Sandoval, male   DOB: [birth year], 78 y.o.   MRN: [id number]  HPI: Scheduled return visit for this very nice older gentleman with coronary disease and multiple cardiovascular risk factors.  Since he was last evaluated, he has done quite well without significant additional medical issues and with generally good exercise tolerance and no cardiopulmonary symptoms.  He does experience fatigue when he exerts excessively and finds that his exercise capacity has decreased somewhat.  He was recently seen by Dr. Darrick Penna.  CBC obtained in conjunction with that visit showed a moderate anemia.  Prior iron studies have not suggested iron deficiency.  Upper and lower endoscopy in 2008 and colonoscopy in 2011 did not reveal a source of blood loss.  Prior to Admission medications   Medication Sig Start Date End Date Taking? Authorizing Provider  amLODipine (NORVASC) 10 MG tablet Take 1 tablet (10 mg total) by mouth daily. 04/28/12  Yes Kathlen Brunswick, MD  donepezil (ARICEPT) 10 MG tablet Take 10 mg by mouth at bedtime.  04/21/12  Yes Historical Provider, MD  doxazosin (CARDURA) 8 MG tablet take 1 tablet by mouth at bedtime 04/26/12  Yes Kathlen Brunswick, MD  lisinopril (PRINIVIL,ZESTRIL) 20 MG tablet Take 20 mg by mouth daily.     Yes Historical Provider, MD  simvastatin (ZOCOR) 20 MG tablet Take 20 mg by mouth at bedtime.     Yes Historical Provider, MD   No Known Allergies    Past medical history, social history, and family history reviewed and updated.  ROS: Denies orthopnea, PND, peripheral edema, palpitations, lightheadedness or syncope.  No melena, hematochezia, hematemesis or other GI symptoms.  All other systems reviewed and are negative.  PHYSICAL EXAM: BP 142/76  Pulse 58  Ht 6\' 2"  (1.88 m)  Wt 71.215 kg (157 lb)  BMI 20.16 kg/m2  General-Well developed; no acute distress Body habitus-Tall and lanky Neck-No JVD; no carotid bruits Lungs-clear lung fields; resonant to  percussion Cardiovascular-normal PMI; normal S1 and S2; modest systolic ejection murmur across the precordium Abdomen-normal bowel sounds; soft and non-tender without masses or organomegaly Musculoskeletal-No deformities, no cyanosis or clubbing Neurologic-Normal cranial nerves; symmetric strength and tone Skin-Warm, no significant lesions Extremities-distal pulses intact; no edema; arachnodactyly  ASSESSMENT AND PLAN:  Vinita Bing, MD 06/15/2012 2:04 PM

## 2012-06-15 NOTE — Assessment & Plan Note (Signed)
No recent lipid profile; one will be repeated.

## 2012-06-15 NOTE — Assessment & Plan Note (Signed)
Weight stable over the past 6 months.

## 2012-06-16 NOTE — Telephone Encounter (Signed)
Mazzie from the Dr. Thornton Papas office and she said that it will be about 2-3 weeks before he can get in to see Dr. Mariel Sleet because he is going out of town.

## 2012-06-22 ENCOUNTER — Other Ambulatory Visit: Payer: Self-pay | Admitting: Cardiology

## 2012-06-22 LAB — LIPID PANEL: Cholesterol: 162 mg/dL (ref 0–200)

## 2012-06-22 LAB — COMPREHENSIVE METABOLIC PANEL
ALT: 9 U/L (ref 0–53)
CO2: 28 mEq/L (ref 19–32)
Calcium: 9.4 mg/dL (ref 8.4–10.5)
Chloride: 110 mEq/L (ref 96–112)
Sodium: 143 mEq/L (ref 135–145)
Total Protein: 6.7 g/dL (ref 6.0–8.3)

## 2012-06-23 ENCOUNTER — Encounter: Payer: Self-pay | Admitting: Cardiology

## 2012-06-24 ENCOUNTER — Ambulatory Visit (HOSPITAL_COMMUNITY): Payer: Medicare Other

## 2012-06-25 ENCOUNTER — Encounter (HOSPITAL_COMMUNITY): Payer: Medicare Other | Attending: Internal Medicine

## 2012-06-25 ENCOUNTER — Encounter (HOSPITAL_COMMUNITY): Payer: Self-pay

## 2012-06-25 ENCOUNTER — Ambulatory Visit (HOSPITAL_COMMUNITY)
Admission: RE | Admit: 2012-06-25 | Discharge: 2012-06-25 | Disposition: A | Discharge status: Home / Self Care | Payer: Medicare Other | Source: Ambulatory Visit | Attending: Internal Medicine | Admitting: Internal Medicine

## 2012-06-25 VITALS — BP 178/74 | HR 47 | Temp 97.8°F | Resp 16 | Ht 74.5 in | Wt 154.8 lb

## 2012-06-25 DIAGNOSIS — I129 Hypertensive chronic kidney disease with stage 1 through stage 4 chronic kidney disease, or unspecified chronic kidney disease: Secondary | ICD-10-CM | POA: Insufficient documentation

## 2012-06-25 DIAGNOSIS — J984 Other disorders of lung: Secondary | ICD-10-CM | POA: Insufficient documentation

## 2012-06-25 DIAGNOSIS — R911 Solitary pulmonary nodule: Secondary | ICD-10-CM

## 2012-06-25 DIAGNOSIS — D539 Nutritional anemia, unspecified: Secondary | ICD-10-CM

## 2012-06-25 DIAGNOSIS — D649 Anemia, unspecified: Secondary | ICD-10-CM | POA: Insufficient documentation

## 2012-06-25 DIAGNOSIS — E119 Type 2 diabetes mellitus without complications: Secondary | ICD-10-CM | POA: Insufficient documentation

## 2012-06-25 DIAGNOSIS — N189 Chronic kidney disease, unspecified: Secondary | ICD-10-CM | POA: Insufficient documentation

## 2012-06-25 LAB — CBC
HCT: 32.2 % — ABNORMAL LOW (ref 39.0–52.0)
Hemoglobin: 10.6 g/dL — ABNORMAL LOW (ref 13.0–17.0)
MCV: 87 fL (ref 78.0–100.0)
RBC: 3.7 MIL/uL — ABNORMAL LOW (ref 4.22–5.81)
WBC: 4.4 10*3/uL (ref 4.0–10.5)

## 2012-06-25 LAB — DIFFERENTIAL
Basophils Absolute: 0 10*3/uL (ref 0.0–0.1)
Eosinophils Relative: 3 % (ref 0–5)
Lymphocytes Relative: 24 % (ref 12–46)
Lymphs Abs: 1 10*3/uL (ref 0.7–4.0)
Monocytes Absolute: 0.2 10*3/uL (ref 0.1–1.0)
Monocytes Relative: 5 % (ref 3–12)
Neutro Abs: 2.9 10*3/uL (ref 1.7–7.7)

## 2012-06-25 LAB — IRON AND TIBC
Iron: 60 ug/dL (ref 42–135)
Saturation Ratios: 29 % (ref 20–55)
TIBC: 206 ug/dL — ABNORMAL LOW (ref 215–435)
UIBC: 146 ug/dL (ref 125–400)

## 2012-06-25 LAB — FOLATE: Folate: 15.3 ng/mL

## 2012-06-25 LAB — RETICULOCYTES: Retic Count, Absolute: 29.6 10*3/uL (ref 19.0–186.0)

## 2012-06-25 NOTE — Patient Instructions (Addendum)
Dominic Sandoval  DOB [birth year] CSN [id number]  MRN [id number] Dr. Si Gaul [facility]  Discharge Instructions  RECOMMENDATIONS MADE BY THE CONSULTANT AND ANY TEST RESULTS WILL BE SENT TO YOUR REFERRING DOCTOR.   EXAM FINDINGS BY MD TODAY AND SIGNS AND SYMPTOMS TO REPORT TO CLINIC OR PRIMARY MD: Exam and discussion per Dr. Arbutus Ped.  We will check some additional blood work today, and do a chest xray to follow-up on the lung nodule that was seen previously.  MEDICATIONS PRESCRIBED: none   INSTRUCTIONS GIVEN AND DISCUSSED: Other :  We will be in touch with you if there are any problems found on your labwork and xray done today.  SPECIAL INSTRUCTIONS/FOLLOW-UP: Lab work Needed today, Xray Studies Needed today and Return to Clinic in 2 weeks.   I acknowledge that I have been informed and understand all the instructions given to me and received a copy. I do not have any more questions at this time, but understand that I may call the Specialty Clinic at [facility] at [phone] during business hours should I have any further questions or need assistance in obtaining follow-up care.    __________________________________________  _____________  __________ Signature of Patient or Authorized Representative            Date                   Time    __________________________________________ Nurse's Signature

## 2012-06-25 NOTE — Progress Notes (Signed)
Dominic [phone]   [phone]  CONSULT NOTE  REASON FOR CONSULTATION:  78 years old [place] American male with persistent anemia.  HPI Dominic Sandoval is a 78 y.o. male was past medical history significant for hypertension, dyslipidemia, diabetes mellitus, Cholilithiasis, congestive heart failure, gout, benign prostatic hypertrophy, history of pulmonary nodules, chronic renal insufficiency, history of colon cancer status post hemicolectomy 1990 and history of prostate cancer status post radiotherapy. The patient was seen recently by his primary care physician Dr. Darrick Penna and has blood work on 06/10/2012 which showed hemoglobin of 9.8 and hematocrit 30.4%. The patient also has mildly decreased white blood count of 3.9 and the low platelets count of 137,000. Looking at his previous records his hemoglobin has always been in the range between 9-10 g/dL for the last several years. The patient is not currently on any nutritional supplements. He has some weakness and lack of energy but denied having any significant dizzy spells. He has no bleeding issues, specifically he denied having any significant epistaxis, hemoptysis, hematemesis or rectal bleeding.  The patient also has a history of small pulmonary nodule diagnosed in 2009 but no followup imaging was done.  He lost several pounds recently. He denied having any significant chest pain, shortness breath, cough or hemoptysis. He has no nausea or vomiting or change in his bowel movement. The patient is a widow and has one daughter her name is Dominic Sandoval. He used to work for the [employer]. He has a history of smoking less than one pack per day for around 50 years and quit 10 years ago. The patient also has a history of a alcohol abuse but he quit several years ago. No history of drug abuse.  @SFHPI @  Past Medical History  Diagnosis Date  . Arteriosclerotic cardiovascular disease (ASCVD)    nonobstructive; 50% LAD in 12/2007, mild pulmonary hypertension, elevated PCW; 7/09-normal EF;   h/o CHF in 2/09 with EF of 35-40% on echo; EF of 35-40% in early 2009, but nl in 7/09; 3/09-mild to mod. CAD; 50% LAD; 30% RCA/CX  . Sinus node dysfunction     has not tolerated beta blocker; rate as low as 34 on Holter in 2010; asymptomatic  . Prostate cancer 2004    h/o benign prostatic hypertrophy; treated with external RT in 2004  . Diabetes mellitus, type 2     Reason CBGs(2013) normal in the absence of treatment  . Gastroparesis     Mild, 30% retained contents at 2 hours  . Normocytic anemia     attributed to chronic kidney disease  . Cholelithiasis   . Colonic adenoma 2008, 2011    TCS and polypectomy 2008, 2011 with Hemoccult positive stool  . Hyperlipidemia   . Hypertension   . Colon cancer 1990    right hemicolectomy-1990  . Chronic kidney disease     Cr-1.73 in 06/2008;  1.3 in 07/2010  . Gout   . Pulmonary nodule 10/2007    Left lower lobe, 5 mm, subpleural 10/2007  . Anemia     Past Surgical History  Procedure Date  . Hemicolectomy 1990    Right; carcinoma  . Esophagogastroduodenoscopy 04/19/2007    Normal esophagus without evidence of Barrett's,/ Normal stomach without evidence of erosions/ Normal duodenal  . Colonoscopy w/ polypectomy  05/04/2007    A 6 mm transverse colon polyp removed/A 3 mm transverse colon polyp removed /  An 8 mm sessile sigmoid colon polyp removed   .  Colonoscopy 07/23/2010    adenomas/internal hemorrhoids  . Prostate biopsy     Family History  Problem Relation Age of Onset  . Colon cancer Father 55  . Hypertension Sister     Social History History  Substance Use Topics  . Smoking status: Former Smoker -- 1.0 packs/day for 15 years  . Smokeless tobacco: Never Used  . Alcohol Use: No    No Known Allergies  Current Outpatient Prescriptions  Medication Sig Dispense Refill  . amLODipine (NORVASC) 10 MG tablet take 1 tablet by mouth once  daily  30 tablet  0  . donepezil (ARICEPT) 10 MG tablet Take 10 mg by mouth at bedtime.       Marland Kitchen doxazosin (CARDURA) 8 MG tablet take 1 tablet by mouth at bedtime  30 tablet  0  . lisinopril (PRINIVIL,ZESTRIL) 20 MG tablet Take 20 mg by mouth daily.        . simvastatin (ZOCOR) 20 MG tablet Take 20 mg by mouth at bedtime.        Marland Kitchen DISCONTD: doxazosin (CARDURA) 8 MG tablet take 1 tablet by mouth at bedtime  30 tablet  0    Review of Systems  A comprehensive review of systems was negative except for: Constitutional: positive for fatigue and weight loss  Physical Exam  WUJ:WJXBJ, healthy, no distress, well nourished and well developed SKIN: skin color, texture, turgor are normal HEAD: Normocephalic EYES: normal EARS: External ears normal OROPHARYNX:no exudate and no erythema  NECK: supple, no adenopathy LYMPH:  no palpable lymphadenopathy, no hepatosplenomegaly LUNGS: clear to auscultation  HEART: regular rate & rhythm, no murmurs and no gallops ABDOMEN:abdomen soft, non-tender, normal bowel sounds and no masses or organomegaly BACK: Back symmetric, no curvature. EXTREMITIES:no joint deformities, effusion, or inflammation, no edema, no skin discoloration, no clubbing  NEURO: alert & oriented x 3 with fluent speech, no focal motor/sensory deficits  PERFORMANCE STATUS: ECOG 1  LABORATORY DATA: Lab Results  Component Value Date   WBC 3.9* 06/10/2012   HGB 9.8* 06/10/2012   HCT 30.4* 06/10/2012   MCV 85.4 06/10/2012   PLT 137* 06/10/2012      Chemistry      Component Value Date/Time   NA 143 06/22/2012 0931   K 4.7 06/22/2012 0931   CL 110 06/22/2012 0931   CO2 28 06/22/2012 0931   BUN 25* 06/22/2012 0931   CREATININE 1.57* 06/22/2012 0931   CREATININE 1.29 08/12/2010 1939      Component Value Date/Time   CALCIUM 9.4 06/22/2012 0931   ALKPHOS 68 06/22/2012 0931   AST 16 06/22/2012 0931   ALT 9 06/22/2012 0931   BILITOT 0.4 06/22/2012 0931       RADIOGRAPHIC STUDIES: No results  found.  ASSESSMENT: This is a very pleasant 78 years old [place] American male with persistent anemia and normal iron study. His anemia is most likely anemia of chronic disease secondary to his multiple medical problems and chronic renal insufficiency but I cannot rule out myelodysplastic syndrome at this point.  PLAN: I ordered several studies today to evaluate his anemia including repeat CBC, comprehensive metabolic panel, LDH, iron study, ferritin, serum B12, serum folate, serum erythropoietin level, and serum protein electrophoresis. If no clear etiology from these blood work, the patient would be considered for bone marrow biopsy and aspirate to rule out myelodysplastic syndrome. For the history of pulmonary emboli I ordered chest x-ray today that showed no acute cardiopulmonary abnormalities By the radiologist requested a noncontrast CT for  further evaluation of the pulmonary nodules that was seen on the previous CT scan in 2009. I ordered a scan today. The patient will followup with Dr. Mariel Sleet in 2 weeks for evaluation and discussion of his lab and imaging results and further recommendation regarding his anemia. The patient was advised to call immediately if he has any concerning symptoms in the interval. I gave the patient and his daughter the time to ask questions and I answered them completely to their satisfaction.  All questions were answered. The patient knows to call the clinic with any problems, questions or concerns. We can certainly see the patient much sooner if necessary.  Thank you so much for allowing me to participate in the care of Dominic Sandoval. I will continue to follow up the patient with you and assist in his care.  I spent 30 minutes counseling the patient face to face. The total time spent in the appointment was 55 minutes.  Joette Schmoker K. 06/25/2012, 10:32 AM

## 2012-06-28 ENCOUNTER — Encounter (HOSPITAL_COMMUNITY): Payer: Self-pay | Admitting: Pharmacy Technician

## 2012-06-28 MED ORDER — SODIUM CHLORIDE 0.45 % IV SOLN: INTRAVENOUS | Status: DC

## 2012-06-29 ENCOUNTER — Encounter (HOSPITAL_COMMUNITY): Admission: RE | Payer: Self-pay | Source: Ambulatory Visit

## 2012-06-29 ENCOUNTER — Ambulatory Visit (HOSPITAL_COMMUNITY): Admission: RE | Admit: 2012-06-29 | Payer: Medicare Other | Source: Ambulatory Visit | Admitting: Gastroenterology

## 2012-06-29 LAB — MULTIPLE MYELOMA PANEL, SERUM
Albumin ELP: 55.7 % — ABNORMAL LOW (ref 55.8–66.1)
Alpha-1-Globulin: 4.6 % (ref 2.9–4.9)
Gamma Globulin: 18.4 % (ref 11.1–18.8)
IgG (Immunoglobin G), Serum: 1410 mg/dL (ref 650–1600)
IgM, Serum: 25 mg/dL — ABNORMAL LOW (ref 41–251)

## 2012-06-29 LAB — ERYTHROPOIETIN: Erythropoietin: 29.9 m[IU]/mL (ref 2.6–34.0)

## 2012-06-29 SURGERY — EGD (ESOPHAGOGASTRODUODENOSCOPY)
Anesthesia: Moderate Sedation

## 2012-06-30 ENCOUNTER — Telehealth: Payer: Self-pay | Admitting: Gastroenterology

## 2012-06-30 ENCOUNTER — Ambulatory Visit (HOSPITAL_COMMUNITY)
Admission: RE | Admit: 2012-06-30 | Discharge: 2012-06-30 | Disposition: A | Discharge status: Home / Self Care | Payer: Medicare Other | Source: Ambulatory Visit | Attending: Internal Medicine | Admitting: Internal Medicine

## 2012-06-30 ENCOUNTER — Other Ambulatory Visit (HOSPITAL_COMMUNITY): Payer: Medicare Other

## 2012-06-30 DIAGNOSIS — D649 Anemia, unspecified: Secondary | ICD-10-CM

## 2012-06-30 DIAGNOSIS — J984 Other disorders of lung: Secondary | ICD-10-CM | POA: Insufficient documentation

## 2012-06-30 DIAGNOSIS — K8020 Calculus of gallbladder without cholecystitis without obstruction: Secondary | ICD-10-CM | POA: Insufficient documentation

## 2012-06-30 DIAGNOSIS — Q619 Cystic kidney disease, unspecified: Secondary | ICD-10-CM | POA: Insufficient documentation

## 2012-06-30 DIAGNOSIS — D539 Nutritional anemia, unspecified: Secondary | ICD-10-CM

## 2012-06-30 DIAGNOSIS — R911 Solitary pulmonary nodule: Secondary | ICD-10-CM

## 2012-06-30 NOTE — Telephone Encounter (Signed)
I called and LMOM for patient to return my call I was following up with to R/S EGD that he no showed for

## 2012-07-01 ENCOUNTER — Other Ambulatory Visit: Payer: Self-pay | Admitting: Gastroenterology

## 2012-07-01 DIAGNOSIS — D649 Anemia, unspecified: Secondary | ICD-10-CM

## 2012-07-09 ENCOUNTER — Encounter (HOSPITAL_BASED_OUTPATIENT_CLINIC_OR_DEPARTMENT_OTHER): Payer: Medicare Other | Admitting: Oncology

## 2012-07-09 ENCOUNTER — Ambulatory Visit (HOSPITAL_COMMUNITY)
Admission: RE | Admit: 2012-07-09 | Discharge: 2012-07-09 | Disposition: A | Discharge status: Home / Self Care | Payer: Medicare Other | Source: Ambulatory Visit | Attending: Oncology | Admitting: Oncology

## 2012-07-09 VITALS — BP 145/67 | HR 50 | Temp 97.7°F | Resp 16 | Wt 152.4 lb

## 2012-07-09 DIAGNOSIS — D649 Anemia, unspecified: Secondary | ICD-10-CM | POA: Insufficient documentation

## 2012-07-09 DIAGNOSIS — N289 Disorder of kidney and ureter, unspecified: Secondary | ICD-10-CM

## 2012-07-09 DIAGNOSIS — Z8546 Personal history of malignant neoplasm of prostate: Secondary | ICD-10-CM | POA: Insufficient documentation

## 2012-07-09 DIAGNOSIS — I10 Essential (primary) hypertension: Secondary | ICD-10-CM | POA: Insufficient documentation

## 2012-07-09 DIAGNOSIS — D696 Thrombocytopenia, unspecified: Secondary | ICD-10-CM

## 2012-07-09 DIAGNOSIS — D801 Nonfamilial hypogammaglobulinemia: Secondary | ICD-10-CM

## 2012-07-09 DIAGNOSIS — D72819 Decreased white blood cell count, unspecified: Secondary | ICD-10-CM

## 2012-07-09 DIAGNOSIS — Z85038 Personal history of other malignant neoplasm of large intestine: Secondary | ICD-10-CM | POA: Insufficient documentation

## 2012-07-09 DIAGNOSIS — E119 Type 2 diabetes mellitus without complications: Secondary | ICD-10-CM | POA: Insufficient documentation

## 2012-07-09 NOTE — Progress Notes (Signed)
Problem #1 anemia, thrombocytopenia, intermittent leukopenia, monoclonal protein spike of IgG class, decreased IgM levels and decreased IgA levels. Problem #2 pulmonary nodules which are stable compared 2009 no longer be concerned about.  Problem #3 history of prostate cancer treated with radiation therapy years ago at the [facility] though he cannot remember who treated. Problem 4 history of colon cancer Problem 5 history of diabetes mellitus2 Problem #6 history of hypertension Problem #7 hyperlipidemia  His kidney function is mildly elevated. He is also mild diabetic. And he has a normal B12 level, normal folic acid level, unremarkable iron studies. He has a normocytic anemia with mildly low platelets and intermittent leukopenia.  He has had what sounds like pelvic radiation 10 years ago.  So I will do a bone survey on him, light chain analysis, and urinalysis for 24 hours for creatinine clearance and total urinary protein and urine electrophoresis.  I will see him back in 3 weeks. If any these things point more towards doing a bone marrow biopsy we will schedule that procedure at that time. His IgG total value is normal but IgA and IgM levels are low.  We'll see him back then and make some decisions. He may just need to be watched going forward. Presently he is not symptomatic

## 2012-07-09 NOTE — Addendum Note (Signed)
Addended by: Hester Mates A on: 07/09/2012 04:14 PM   Modules accepted: Orders

## 2012-07-09 NOTE — Patient Instructions (Signed)
[facility]  Discharge Instructions  RECOMMENDATIONS MADE BY THE CONSULTANT AND ANY TEST RESULTS WILL BE SENT TO YOUR REFERRING DOCTOR.   SPECIAL INSTRUCTIONS/FOLLOW-UP: Lab work Needed today, Xray Studies Needed today as well.  Please go to radiology on your way out today.  Return to Clinic in 3 weeks to see Dr. Mariel Sleet.     I acknowledge that I have been informed and understand all the instructions given to me and received a copy. I do not have any more questions at this time, but understand that I may call the Specialty Clinic at [facility] at [phone] during business hours should I have any further questions or need assistance in obtaining follow-up care.    __________________________________________  _____________  __________ Signature of Patient or Authorized Representative            Date                   Time    __________________________________________ Nurse's Signature

## 2012-07-12 LAB — BETA 2 MICROGLOBULIN, SERUM: Beta-2 Microglobulin: 3.67 mg/L — ABNORMAL HIGH (ref 1.01–1.73)

## 2012-07-12 LAB — KAPPA/LAMBDA LIGHT CHAINS: Kappa, lambda light chain ratio: 3.68 — ABNORMAL HIGH (ref 0.26–1.65)

## 2012-07-13 LAB — PROTEIN, URINE, 24 HOUR: Urine Total Volume-UPROT: 2050 mL

## 2012-07-13 LAB — CREATININE CLEARANCE, URINE, 24 HOUR
Creatinine, Urine: 65.27 mg/dL
Creatinine: 1.68 mg/dL — ABNORMAL HIGH (ref 0.50–1.35)

## 2012-07-13 LAB — CREATININE, SERUM: GFR calc Af Amer: 43 mL/min — ABNORMAL LOW (ref >90)

## 2012-07-13 NOTE — Addendum Note (Signed)
Addended by: Evelena Leyden on: 07/13/2012 11:19 AM   Modules accepted: Orders

## 2012-07-13 NOTE — Addendum Note (Signed)
Addended by: Evelena Leyden on: 07/13/2012 10:45 AM   Modules accepted: Orders

## 2012-07-17 LAB — UIFE/LIGHT CHAINS/TP QN, 24-HR UR
Beta, Urine: DETECTED — AB
Free Lambda Excretion/Day: 13.53 mg/d
Free Lambda Lt Chains,Ur: 0.66 mg/dL (ref 0.02–0.67)
Free Lt Chn Excr Rate: 537.1 mg/d
Time: 24 hours
Total Protein, Urine-Ur/day: 670 mg/d — ABNORMAL HIGH (ref 10–140)
Total Protein, Urine: 32.7 mg/dL
Volume, Urine: 2050 mL

## 2012-07-26 MED ORDER — SODIUM CHLORIDE 0.45 % IV SOLN
INTRAVENOUS | Status: DC
  Administered 2012-07-27: 1000 mL via INTRAVENOUS

## 2012-07-27 ENCOUNTER — Encounter (HOSPITAL_COMMUNITY)
Admission: RE | Disposition: A | Discharge status: Home / Self Care | Payer: Self-pay | Source: Ambulatory Visit | Attending: Gastroenterology

## 2012-07-27 ENCOUNTER — Other Ambulatory Visit: Payer: Self-pay | Admitting: Cardiology

## 2012-07-27 ENCOUNTER — Ambulatory Visit (HOSPITAL_COMMUNITY)
Admission: RE | Admit: 2012-07-27 | Discharge: 2012-07-27 | Disposition: A | Discharge status: Home / Self Care | Payer: Medicare Other | Source: Ambulatory Visit | Attending: Gastroenterology | Admitting: Gastroenterology

## 2012-07-27 ENCOUNTER — Encounter (HOSPITAL_COMMUNITY): Payer: Self-pay | Admitting: *Deleted

## 2012-07-27 DIAGNOSIS — D649 Anemia, unspecified: Secondary | ICD-10-CM

## 2012-07-27 DIAGNOSIS — D131 Benign neoplasm of stomach: Secondary | ICD-10-CM | POA: Insufficient documentation

## 2012-07-27 DIAGNOSIS — K222 Esophageal obstruction: Secondary | ICD-10-CM

## 2012-07-27 DIAGNOSIS — K2980 Duodenitis without bleeding: Secondary | ICD-10-CM | POA: Insufficient documentation

## 2012-07-27 DIAGNOSIS — I10 Essential (primary) hypertension: Secondary | ICD-10-CM | POA: Insufficient documentation

## 2012-07-27 DIAGNOSIS — K2990 Gastroduodenitis, unspecified, without bleeding: Secondary | ICD-10-CM

## 2012-07-27 DIAGNOSIS — K2940 Chronic atrophic gastritis without bleeding: Secondary | ICD-10-CM | POA: Insufficient documentation

## 2012-07-27 DIAGNOSIS — K2970 Gastritis, unspecified, without bleeding: Secondary | ICD-10-CM

## 2012-07-27 DIAGNOSIS — K449 Diaphragmatic hernia without obstruction or gangrene: Secondary | ICD-10-CM | POA: Insufficient documentation

## 2012-07-27 DIAGNOSIS — E119 Type 2 diabetes mellitus without complications: Secondary | ICD-10-CM | POA: Insufficient documentation

## 2012-07-27 DIAGNOSIS — Z79899 Other long term (current) drug therapy: Secondary | ICD-10-CM | POA: Insufficient documentation

## 2012-07-27 DIAGNOSIS — E785 Hyperlipidemia, unspecified: Secondary | ICD-10-CM | POA: Insufficient documentation

## 2012-07-27 HISTORY — PX: ESOPHAGOGASTRODUODENOSCOPY: SHX5428

## 2012-07-27 SURGERY — EGD (ESOPHAGOGASTRODUODENOSCOPY)
Anesthesia: Moderate Sedation

## 2012-07-27 MED ORDER — MIDAZOLAM HCL 5 MG/5ML IJ SOLN
INTRAMUSCULAR | Status: AC
  Filled 2012-07-27: qty 10

## 2012-07-27 MED ORDER — MEPERIDINE HCL 100 MG/ML IJ SOLN
INTRAMUSCULAR | Status: DC | PRN
  Administered 2012-07-27: 25 mg via INTRAVENOUS

## 2012-07-27 MED ORDER — MIDAZOLAM HCL 5 MG/5ML IJ SOLN
INTRAMUSCULAR | Status: DC | PRN
  Administered 2012-07-27: 2 mg via INTRAVENOUS

## 2012-07-27 MED ORDER — STERILE WATER FOR IRRIGATION IR SOLN
Status: DC | PRN
  Administered 2012-07-27: 12:00:00

## 2012-07-27 MED ORDER — BUTAMBEN-TETRACAINE-BENZOCAINE 2-2-14 % EX AERO
INHALATION_SPRAY | CUTANEOUS | Status: DC | PRN
  Administered 2012-07-27: 2 via TOPICAL

## 2012-07-27 MED ORDER — ATROPINE SULFATE 1 MG/ML IJ SOLN
INTRAMUSCULAR | Status: DC | PRN
  Administered 2012-07-27: 1 mg via INTRAVENOUS

## 2012-07-27 MED ORDER — MEPERIDINE HCL 100 MG/ML IJ SOLN
INTRAMUSCULAR | Status: AC
  Filled 2012-07-27: qty 2

## 2012-07-27 NOTE — H&P (Signed)
Primary Care Physician:  Avon Gully, MD Primary Gastroenterologist:  Dr. Darrick Penna  Pre-Procedure History & Physical: HPI:  Dominic Sandoval is a 78 y.o. male here for  ANEMIA.   Past Medical History  Diagnosis Date  . Arteriosclerotic cardiovascular disease (ASCVD)     nonobstructive; 50% LAD in 12/2007, mild pulmonary hypertension, elevated PCW; 7/09-normal EF;   h/o CHF in 2/09 with EF of 35-40% on echo; EF of 35-40% in early 2009, but nl in 7/09; 3/09-mild to mod. CAD; 50% LAD; 30% RCA/CX  . Sinus node dysfunction     has not tolerated beta blocker; rate as low as 34 on Holter in 2010; asymptomatic  . Prostate cancer 2004    h/o benign prostatic hypertrophy; treated with external RT in 2004  . Diabetes mellitus, type 2     Reason CBGs(2013) normal in the absence of treatment  . Gastroparesis     Mild, 30% retained contents at 2 hours  . Normocytic anemia     attributed to chronic kidney disease  . Cholelithiasis   . Colonic adenoma 2008, 2011    TCS and polypectomy 2008, 2011 with Hemoccult positive stool  . Hyperlipidemia   . Hypertension   . Colon cancer 1990    right hemicolectomy-1990  . Chronic kidney disease     Cr-1.73 in 06/2008;  1.3 in 07/2010  . Gout   . Pulmonary nodule 10/2007    Left lower lobe, 5 mm, subpleural 10/2007  . Anemia     Past Surgical History  Procedure Date  . Hemicolectomy 1990    Right; carcinoma  . Esophagogastroduodenoscopy 04/19/2007    Normal esophagus without evidence of Barrett's,/ Normal stomach without evidence of erosions/ Normal duodenal  . Colonoscopy w/ polypectomy  05/04/2007    A 6 mm transverse colon polyp removed/A 3 mm transverse colon polyp removed /  An 8 mm sessile sigmoid colon polyp removed   . Colonoscopy 07/23/2010    adenomas/internal hemorrhoids  . Prostate biopsy     Prior to Admission medications   Medication Sig Start Date End Date Taking? Authorizing Provider  amLODipine (NORVASC) 10 MG tablet Take 10 mg by  mouth daily.   Yes Historical Provider, MD  donepezil (ARICEPT) 10 MG tablet Take 10 mg by mouth at bedtime.  04/21/12  Yes Historical Provider, MD  doxazosin (CARDURA) 8 MG tablet Take 8 mg by mouth at bedtime.   Yes Historical Provider, MD  lisinopril (PRINIVIL,ZESTRIL) 20 MG tablet Take 20 mg by mouth daily.    Yes Historical Provider, MD  simvastatin (ZOCOR) 20 MG tablet Take 20 mg by mouth at bedtime.    Yes Historical Provider, MD    Allergies as of 07/01/2012  . (No Known Allergies)    Family History  Problem Relation Age of Onset  . Colon cancer Father 54  . Hypertension Sister     History   Social History  . Marital Status: Widowed    Spouse Name: N/A    Number of Children: 1  . Years of Education: N/A   Occupational History  . Not on file.   Social History Main Topics  . Smoking status: Former Smoker -- 1.0 packs/day for 15 years  . Smokeless tobacco: Never Used  . Alcohol Use: No  . Drug Use: No  . Sexually Active: Not on file   Other Topics Concern  . Not on file   Social History Narrative  . No narrative on file    Review of  Systems: See HPI, otherwise negative ROS   Physical Exam: BP 187/82  Pulse 45  Temp 97.6 F (36.4 C) (Oral)  Resp 16  Ht 6\' 2"  (1.88 m)  Wt 152 lb (68.947 kg)  BMI 19.52 kg/m2  SpO2 100% General:   Alert,  pleasant and cooperative in NAD Head:  Normocephalic and atraumatic. Neck:  Supple; Lungs:  Clear throughout to auscultation.    Heart:  Regular rate and rhythm. Abdomen:  Soft, nontender and nondistended. Normal bowel sounds, without guarding, and without rebound.   Neurologic:  Alert and  oriented x4;  grossly normal neurologically.  Impression/Plan:     Anemia  PLAN:  1. EGD TODAY

## 2012-07-28 NOTE — Op Note (Signed)
[facility] [address] [place], [address]   ENDOSCOPY PROCEDURE REPORT  PATIENT: Dominic Sandoval, Dominic Sandoval  MR#: [id number] BIRTHDATE: [birth year] , 81  yrs. old GENDER: Male  ENDOSCOPIST: Jonette Eva, MD REFERRED XB:[id number] Fanta, M.D.  Glenford Peers, M.D.  PROCEDURE DATE: 07/27/2012 PROCEDURE:   EGD w/ biopsy  INDICATIONS:anemia. MEDICATIONS: Demerol 25 mg IV and Versed 2 mg IV TOPICAL ANESTHETIC:   Cetacaine Spray  DESCRIPTION OF PROCEDURE:     Physical exam was performed.  Informed consent was obtained from the patient after explaining the benefits, risks, and alternatives to the procedure.  The patient was connected to the monitor and placed in the left lateral position.  Continuous oxygen was provided by nasal cannula and IV medicine administered through an indwelling cannula.  After administration of sedation, the patients esophagus was intubated and the Pentax Gastroscope [id number]  endoscope was advanced under direct visualization to the second portion of the duodenum.  The scope was removed slowly by carefully examining the color, texture, anatomy, and integrity of the mucosa on the way out.  The patient was recovered in endoscopy and discharged home in satisfactory condition.      ESOPHAGUS: A stricture was found at the gastroesophageal junction. The stenosis was traversable with the endoscope.   A 1 cm hiatal hernia was noted.  STOMACH: Non-erosive gastritis (inflammation) was found in the gastric antrum.  Multiple biopsies were performed using cold forceps.   Small polyp was found in the gastric fundus.  Multiple biopsies were performed using cold forceps.  DUODENUM: The duodenal mucosa showed no abnormalities in the 2nd part of the duodenum.  Cold forcep biopsies were taken in the second portion.  COMPLICATIONS:   None  ENDOSCOPIC IMPRESSION: 1.   Stricture was found at the gastroesophageal junction 2.   Non-erosive gastritis  (inflammation) was found in the gastric antrum; multiple biopsies 3.   Polyp was found in the gastric fundus; multiple biopsies 4.   The duodenal mucosa showed no abnormalities in the 2nd part of the duodenum  RECOMMENDATIONS: NO OBVIOUS SOURCE FOR ANEMIA WAS IDENTIFIED.  BIOPSY WILL BE BACK IN 7 DAYS.  FOLLOW UP IN 4 MOS.   REPEAT EXAM:   _______________________________ Rosalie DoctorJonette Eva, MD 07/28/2012 1:42 PM       PATIENT NAME:  Cashmere, Clenney MR#: [id number]

## 2012-07-29 ENCOUNTER — Telehealth: Payer: Self-pay | Admitting: Gastroenterology

## 2012-07-29 NOTE — Telephone Encounter (Addendum)
CALLED PT & DISCUSSED RESULTS.  CALLED PT'S DAUGHTER. LVM TO CALL TO DISCUSS RESULTS.  HIS BIOPSY SHOWS ATROPHIC GASTRITIS. IT MEANS HE WILL NOT EFFECTIVELY ABSORB IRON PILLS. HIS IRON STORES ARE CURRENTLY NORMAL. WE WILL CONTINUE TO MONITOR HIS BLOOD LEVEL & if It gets low, we can give him IV IRON. HIS SMALL BOWEL BIOPSIES SHOW INFLAMMATION. USUALLY HE WOULD NEED A PPI, BUT DUE TO HIS NEED TO HAVE ACID TO ABSORB SOME ORAL IRON. WE WILL NOT START A PPIBECAUSE IT WILL SHUT DOWN ACID PRODUCTION IN THE STOMACH. WE WILL MONITOR HIS SX.   OPV IN 4 mos E 30 SLF ANEMIA.   CC: PCP & DR. Mariel Sleet

## 2012-07-29 NOTE — Telephone Encounter (Signed)
Faxed to PCP and Dr Mariel Sleet, recall made

## 2012-07-30 ENCOUNTER — Encounter (HOSPITAL_COMMUNITY): Payer: Medicare Other | Attending: Internal Medicine | Admitting: Oncology

## 2012-07-30 ENCOUNTER — Encounter (HOSPITAL_COMMUNITY): Payer: Self-pay | Admitting: Oncology

## 2012-07-30 VITALS — BP 157/69 | HR 50 | Temp 98.4°F | Resp 16 | Wt 153.3 lb

## 2012-07-30 DIAGNOSIS — D696 Thrombocytopenia, unspecified: Secondary | ICD-10-CM

## 2012-07-30 DIAGNOSIS — D472 Monoclonal gammopathy: Secondary | ICD-10-CM | POA: Insufficient documentation

## 2012-07-30 DIAGNOSIS — C61 Malignant neoplasm of prostate: Secondary | ICD-10-CM

## 2012-07-30 DIAGNOSIS — D892 Hypergammaglobulinemia, unspecified: Secondary | ICD-10-CM | POA: Insufficient documentation

## 2012-07-30 DIAGNOSIS — C189 Malignant neoplasm of colon, unspecified: Secondary | ICD-10-CM

## 2012-07-30 DIAGNOSIS — D649 Anemia, unspecified: Secondary | ICD-10-CM

## 2012-07-30 DIAGNOSIS — C882 Heavy chain disease: Secondary | ICD-10-CM

## 2012-07-30 DIAGNOSIS — D72819 Decreased white blood cell count, unspecified: Secondary | ICD-10-CM

## 2012-07-30 HISTORY — DX: Heavy chain disease: C88.2

## 2012-07-30 MED ORDER — ALPRAZOLAM 0.25 MG PO TABS: ORAL_TABLET | ORAL | Status: DC

## 2012-07-30 MED ORDER — HYDROCODONE-ACETAMINOPHEN 5-325 MG PO TABS: ORAL_TABLET | ORAL | Status: DC

## 2012-07-30 NOTE — Progress Notes (Signed)
Avon Gully, MD [address] [place] [address]  1. Prostate cancer   2. Colon cancer   3. Heavy chain disease, IgG type     CURRENT THERAPY: Work-up  INTERVAL HISTORY: Dominic Sandoval 78 y.o. male returns for  regular  visit for followup of IgG heavy chain disease.   Patient is accompanied by brother-in-law.   Dominic Sandoval has a history of colon cancer and prostate cancer years ago.  He underwent radiation for his prostate cancer at [facility] at the [facility].  He does not remember the treating radiation oncologist.  Unfortunately, Denise does not remember getting radiation for his prostate cancer, but Dr. Thornton Papas note reports that he did.  He therefore does not know if he got external beam radiation or seed implantation  We spent the majority of our time discussing lab results and the role of a bone marrow aspiration and biopsy.  I personally reviewed and went over laboratory results with the patient.  He understands that he has an abnormality in his SPEP and UPEP.  He does have a detectable M-Spike.  I tried to explain this to the best of my ability to him and make him understand this abnormality, namely his IgG heavy chain disease.    I drew him some pictures to help illustrate his lab results.    We then proceeded to discuss the role and procedure of a bone marrow aspiration and biopsy.  We discussed the procedure in detail.  He has consented to go through with the procedure.  We discussed the risks, benefits, alternatives to the procedure.  I will give him low dose Xanax and Hydrocodone in preparation for the procedure.   He denies any complaints.  Complete ROS questioning is negative.   Past Medical History  Diagnosis Date  . Arteriosclerotic cardiovascular disease (ASCVD)     nonobstructive; 50% LAD in 12/2007, mild pulmonary hypertension, elevated PCW; 7/09-normal EF;   h/o CHF in 2/09 with EF of 35-40% on echo; EF of 35-40% in early  2009, but nl in 7/09; 3/09-mild to mod. CAD; 50% LAD; 30% RCA/CX  . Sinus node dysfunction     has not tolerated beta blocker; rate as low as 34 on Holter in 2010; asymptomatic  . Prostate cancer 2004    h/o benign prostatic hypertrophy; treated with external RT in 2004  . Diabetes mellitus, type 2     Reason CBGs(2013) normal in the absence of treatment  . Gastroparesis     Mild, 30% retained contents at 2 hours  . Normocytic anemia     attributed to chronic kidney disease  . Cholelithiasis   . Colonic adenoma 2008, 2011    TCS and polypectomy 2008, 2011 with Hemoccult positive stool  . Hyperlipidemia   . Hypertension   . Colon cancer 1990    right hemicolectomy-1990  . Chronic kidney disease     Cr-1.73 in 06/2008;  1.3 in 07/2010  . Gout   . Pulmonary nodule 10/2007    Left lower lobe, 5 mm, subpleural 10/2007  . Anemia   . Heavy chain disease, IgG type 07/30/2012    has GOUT; Gastroparesis; Cerebrovascular disease; Sinus node dysfunction; Prostate cancer; Normocytic anemia; Hyperlipidemia; Hypertension; Chronic kidney disease; Arteriosclerotic cardiovascular disease (ASCVD); Abnormal weight loss; Colon cancer; Diabetes mellitus, type 2; Anemia; and Heavy chain disease, IgG type on his problem list.      has no known allergies.  Dominic Sandoval had no medications administered during  this visit.  Past Surgical History  Procedure Date  . Hemicolectomy 1990    Right; carcinoma  . Esophagogastroduodenoscopy 04/19/2007    Normal esophagus without evidence of Barrett's,/ Normal stomach without evidence of erosions/ Normal duodenal  . Colonoscopy w/ polypectomy  05/04/2007    A 6 mm transverse colon polyp removed/A 3 mm transverse colon polyp removed /  An 8 mm sessile sigmoid colon polyp removed   . Colonoscopy 07/23/2010    adenomas/internal hemorrhoids  . Prostate biopsy   . Esophagogastroduodenoscopy     Denies any headaches, dizziness, double vision, fevers, chills, night  sweats, nausea, vomiting, diarrhea, constipation, chest pain, heart palpitations, shortness of breath, blood in stool, black tarry stool, urinary pain, urinary burning, urinary frequency, hematuria.   PHYSICAL EXAMINATION  ECOG PERFORMANCE STATUS: 0 - Asymptomatic  Filed Vitals:   07/30/12 1400  BP: 157/69  Pulse: 50  Temp: 98.4 F (36.9 C)  Resp: 16    GENERAL:alert, no distress, well nourished, well developed, comfortable, cooperative and smiling SKIN: skin color, texture, turgor are normal, no rashes or significant lesions HEAD: Normocephalic, No masses, lesions, tenderness or abnormalities EYES: normal, Conjunctiva are pink and non-injected EARS: External ears normal OROPHARYNX:lips, buccal mucosa, and tongue normal and mucous membranes are moist  NECK: supple, trachea midline LYMPH:  not examined BREAST:not examined LUNGS: not examined HEART: not examined ABDOMEN:not examined BACK: not examined EXTREMITIES:not examined  NEURO: alert & oriented x 3 with fluent speech, no focal motor/sensory deficits, gait normal    LABORATORY DATA: Results for Dominic Sandoval, Dominic Sandoval (MRN [id number]) as of 07/30/2012 14:36  Ref. Range 07/13/2012 10:45  Immunofixation, Urine No range found (NOTE)  Time-UPE24 No range found 24  Volume, Urine-UPE24 No range found 2050  Total Protein, Urine-UPE24 No range found 32.7  Total Protein, Urine-Ur/day Latest Range: 10-140 mg/day 670 (H)  ALBUMIN, U Latest Range: DETECTED  DETECTED  Alpha 1, Urine Latest Range: NONE DETECTED  DETECTED (A)  Alpha 2, Urine Latest Range: NONE DETECTED  DETECTED (A)  Beta, Urine Latest Range: NONE DETECTED  DETECTED (A)  Gamma Globulin, Urine Latest Range: NONE DETECTED  DETECTED (A)  Free Kappa Lt Chains,Ur Latest Range: 0.14-2.42 mg/dL 56.21 (H)  Free Lt Chn Excr Rate No range found 537.10  Free Lambda Lt Chains,Ur Latest Range: 0.02-0.67 mg/dL 3.08  Free Lambda Excretion/Day No range found 13.53  Free Kappa/Lambda  Ratio Latest Range: 2.04-10.37 ratio 39.70 (H)    Urine IFE shows a monoclonal IgG heavy chain with associated Kappa light chain and excess monoclonal free Kappa light chains (Bence Jones proteins). Reviewed by Dallas Breeding, MD, PhD, FCAP (Electronic Signature on File)    Results for Dominic Sandoval, Dominic Sandoval (MRN [id number]) as of 07/30/2012 15:30  Ref. Range 06/25/2012 11:13 07/09/2012 16:14  Albumin ELP Latest Range: 55.8-66.1 % 55.7 (L)   Alpha-1-Globulin Latest Range: 2.9-4.9 % 4.6   Alpha-2-Globulin Latest Range: 7.1-11.8 % 10.5   Beta Globulin Latest Range: 4.7-7.2 % 5.1   Beta 2 Latest Range: 3.2-6.5 % 5.7   Gamma Globulin Latest Range: 11.1-18.8 % 18.4   M-SPIKE, % No range found 0.68   SPE Interp. No range found (NOTE)   Comment No range found (NOTE)   IgG (Immunoglobin G), Serum Latest Range: 808-255-9330 mg/dL 9528   IgA Latest Range: 68-379 mg/dL 413 (H)   IgM, Serum Latest Range: 41-251 mg/dL 25 (L)   Kappa free light chain Latest Range: 0.33-1.94 mg/dL  2.44 (H)  Lamda free light chains  Latest Range: 0.57-2.63 mg/dL  8.11  Kappa, lamda light chain ratio Latest Range: 0.26-1.65   3.68 (H)    A restricted band consistent with monoclonal protein is present. The monoclonal protein peak accounts for 0.68 g/dL of the total 9.14 g/dL of protein in the gamma region. Reviewed by Dallas Breeding, MD, PhD, FCAP (Electronic Signature on File)    RADIOGRAPHIC STUDIES:  07/09/2012  *RADIOLOGY REPORT*  Clinical Data: Rule out myeloma.  METASTATIC BONE SURVEY  Comparison: Chest x-ray 06/25/2012  Findings: Multiple images are performed of the axial and  appendicular skeleton. There are degenerative changes throughout  the cervical, thoracic, and lumbar spine. No suspicious lytic or  blastic lesions are identified to suggest presence of myelomatous  deposits. Bone mineral density appears grossly normal. Lungs are  clear. Probable calcified hilar lymph nodes identified. There is    atherosclerotic calcification of the femoral arteries. Surgical  clips are identified in the abdomen.  IMPRESSION:  1. Bone mineral density is grossly normal.  2. Degenerative changes in the spine.  3. No plain film findings to suggest myeloma.  Original Report Authenticated By: Patterson Hammersmith, M.D.     ASSESSMENT:  1. IgG heavy chain disease 2. Anemia, thrombocytopenia, intermittent leukopenia, monoclonal protein spike of IgG class, decreased IgM levels and decreased IgA levels.  3. Pulmonary nodules which are stable compared 2009 no longer be concerned about.  4. History of prostate cancer treated with radiation therapy years ago at the [facility] though he cannot remember who treated.  5. History of colon cancer  6. History of diabetes mellitus, type 2  7. History of hypertension  8. Hyperlipidemia   PLAN:  1. Long discussion regarding his lab results.  2. I personally reviewed and went over laboratory results with the patient. 3. I personally reviewed and went over radiographic studies with the patient. 4. I personally reviewed and went over pathology results with the patient. 5. Long discussion regarding the Bone Marrow Aspiration and Biopsy procedure.  6. Rx for Xanax 0.25 #3 in prep for procedure. 7. Rx for Hydrocodone 5/325 #5 in prep for procedure and following.  8. Bone Marrow Aspiration and Biopsy in 3 weeks.  9. Return for follow-up following Bone Marrow test in 5-6 weeks.    All questions were answered. The patient knows to call the clinic with any problems, questions or concerns. We can certainly see the patient much sooner if necessary.  I spent 25 minutes counseling the patient face to face. The total time spent in the appointment was 30 minutes.  Hellon Vaccarella

## 2012-07-30 NOTE — Telephone Encounter (Signed)
Pt's daughter is aware of results. 

## 2012-07-30 NOTE — Patient Instructions (Addendum)
[facility]  Discharge Instructions  RECOMMENDATIONS MADE BY THE CONSULTANT AND ANY TEST RESULTS WILL BE SENT TO YOUR REFERRING DOCTOR.   EXAM FINDINGS BY MD TODAY AND SIGNS AND SYMPTOMS TO REPORT TO CLINIC OR PRIMARY MD: discussion by PA.  Need to do a bone marrow biopsy and aspiration to see if there's anything going on in your bone marrow.   MEDICATIONS PRESCRIBED: Xanax 0.25mg  and Hydrocodone 5-325mg  - take 1 of each 1 hour before you come for the procedure.  Then follow directions afterwards.   Follow label directions and May cause drowsiness, do not operate machinery while taking this medication  INSTRUCTIONS GIVEN AND DISCUSSED: Other :  You will need someone to bring you for the procedure and you will be here for about 1 hour.  SPECIAL INSTRUCTIONS/FOLLOW-UP: Return to Clinic on 10/22 at 8:30am for the procedure.   I acknowledge that I have been informed and understand all the instructions given to me and received a copy. I do not have any more questions at this time, but understand that I may call the Specialty Clinic at [facility] at [phone] during business hours should I have any further questions or need assistance in obtaining follow-up care.    __________________________________________  _____________  __________ Signature of Patient or Authorized Representative            Date                   Time    __________________________________________ Nurse's Signature

## 2012-08-04 ENCOUNTER — Encounter (HOSPITAL_COMMUNITY): Payer: Self-pay | Admitting: Gastroenterology

## 2012-08-10 ENCOUNTER — Encounter (HOSPITAL_BASED_OUTPATIENT_CLINIC_OR_DEPARTMENT_OTHER): Payer: Medicare Other | Admitting: Oncology

## 2012-08-10 VITALS — BP 120/56 | HR 47 | Temp 97.6°F | Resp 16

## 2012-08-10 DIAGNOSIS — D892 Hypergammaglobulinemia, unspecified: Secondary | ICD-10-CM

## 2012-08-10 DIAGNOSIS — D472 Monoclonal gammopathy: Secondary | ICD-10-CM

## 2012-08-10 HISTORY — PX: BONE MARROW ASPIRATE AND BIOPSY WIITH LUMBAR PUNCTURE: SHX1250

## 2012-08-10 LAB — CBC WITH DIFFERENTIAL/PLATELET
Basophils Relative: 0 % (ref 0–1)
Eosinophils Absolute: 0.2 10*3/uL (ref 0.0–0.7)
Eosinophils Relative: 3 % (ref 0–5)
Lymphs Abs: 1 10*3/uL (ref 0.7–4.0)
MCH: 28.7 pg (ref 26.0–34.0)
MCHC: 32.6 g/dL (ref 30.0–36.0)
MCV: 88 fL (ref 78.0–100.0)
Neutrophils Relative %: 71 % (ref 43–77)
Platelets: 124 10*3/uL — ABNORMAL LOW (ref 150–400)
RBC: 3.76 MIL/uL — ABNORMAL LOW (ref 4.22–5.81)

## 2012-08-10 LAB — BONE MARROW EXAM: Bone Marrow Exam: 743

## 2012-08-10 NOTE — Progress Notes (Signed)
[facility] BONE MARROW BIOPSY/ASPIRATE PROGRESS NOTE  Dominic Sandoval presents for Bone Marrow biopsy per MD orders. Dominic Sandoval verbalized understanding of procedure. Dominic Sandoval po and Dominic Sandoval po taken by patient at 7am at home prior to arrival. Consent reviewed and signed.  Dominic Sandoval positioned supine for procedure. Time-out performed and Bone Marrow Checklist. Procedure began at 0855. Xylocaine 2% 10 cc used for local and administered to patient by Dr. Mariel Sleet. Procedure completed at 0906. Patient tolerated well. Pressure dressing applied to the right hip with instructions to leave in place for 24 hours. Patient instructed to report any bleeding that saturates dressing and to take pain medication Dominic Sandoval as directed. Dressing dry and intact to the right hip on discharge.

## 2012-08-10 NOTE — Patient Instructions (Addendum)
[facility]  Discharge Instructions  RECOMMENDATIONS MADE BY THE CONSULTANT AND ANY TEST RESULTS WILL BE SENT TO YOUR REFERRING DOCTOR.   EXAM FINDINGS BY MD TODAY AND SIGNS AND SYMPTOMS TO REPORT TO CLINIC OR PRIMARY MD: You had a bone marrow biopsy and aspiration today.  Keep the dressing clean, dry and intact for next 24 hours.  If bleeding occurs apply direct pressure to the area for a minimum of 10 minutes.  If bleeding continues, go to Emergency Department.  You can take the hydrocodone if needed for pain every 6 hours.  MEDICATIONS PRESCRIBED: none      SPECIAL INSTRUCTIONS/FOLLOW-UP: Return to Clinic on 11/6 to get results of bone marrow biopsy and aspirate.   I acknowledge that I have been informed and understand all the instructions given to me and received a copy. I do not have any more questions at this time, but understand that I may call the Specialty Clinic at [facility] at [phone] during business hours should I have any further questions or need assistance in obtaining follow-up care.    __________________________________________  _____________  __________ Signature of Patient or Authorized Representative            Date                   Time    __________________________________________ Nurse's Signature

## 2012-08-11 NOTE — Progress Notes (Signed)
Bone marrow biopsy and aspiration procedure note. The procedure was discussed with him previously as well as today. After informed consent he was placed in the prone position. His posterior superior iliac spinous processes were easily identified. The right was chosen. It was then cleansed with 3 Betadine swabs. It was then anesthetized with 9 cc of 2% plain Xylocaine. A bone marrow aspiration and biopsy for routine pathological evaluation as well as flow cytometry and cytogenetics was sent. No complications were encountered. He was sent home in good condition.

## 2012-08-20 LAB — CHROMOSOME ANALYSIS, BONE MARROW

## 2012-08-25 ENCOUNTER — Encounter (HOSPITAL_COMMUNITY): Payer: Medicare Other | Attending: Internal Medicine | Admitting: Oncology

## 2012-08-25 ENCOUNTER — Encounter (HOSPITAL_COMMUNITY): Payer: Self-pay | Admitting: Oncology

## 2012-08-25 VITALS — BP 132/64 | HR 60 | Temp 98.0°F | Resp 16 | Wt 156.8 lb

## 2012-08-25 DIAGNOSIS — D472 Monoclonal gammopathy: Secondary | ICD-10-CM

## 2012-08-25 DIAGNOSIS — R944 Abnormal results of kidney function studies: Secondary | ICD-10-CM

## 2012-08-25 NOTE — Patient Instructions (Addendum)
[facility] Clinic  Discharge Instructions  RECOMMENDATIONS MADE BY THE CONSULTANT AND ANY TEST RESULTS WILL BE SENT TO YOUR REFERRING DOCTOR.   EXAM FINDINGS BY MD TODAY AND SIGNS AND SYMPTOMS TO REPORT TO CLINIC OR PRIMARY MD: Discussion by MD.  Your bone marrow showed changes in your blood that could cause problems in the future.  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  It's not Myeloma yet but it could develop into that.  We just need to watch you and monitor your blood work and will do that every 3 months.  If your daughter has question have her call us Tobie Lords, RN [phone])  MEDICATIONS PRESCRIBED: none   INSTRUCTIONS GIVEN AND DISCUSSED: Other :  Report bone pain, shortness of breath or other problems. Start the 24 hour urine collection on 11/21/12 and bring it in when you come for blood work on 11/22/12.  SPECIAL INSTRUCTIONS/FOLLOW-UP: Lab work Needed in February with 24 hour urine collection prior to coming for blood work and Return to Clinic to see MD about 7 - 10 days later.   I acknowledge that I have been informed and understand all the instructions given to me and received a copy. I do not have any more questions at this time, but understand that I may call the Specialty Clinic at [facility] at [phone] during business hours should I have any further questions or need assistance in obtaining follow-up care.    __________________________________________  _____________  __________ Signature of Patient or Authorized Representative            Date                   Time    __________________________________________ Nurse's Signature

## 2012-08-25 NOTE — Progress Notes (Signed)
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 Cytogenetics are still pending.  This gentleman needs to be observed and I will check his blood work and urine again in 3 months is IgG kappa M spike is 680 mg/dL.  He understands what we need to do an is agreeable for followup. I have told him that if his daughter needs to call me to have her phone me and I will discuss things with her. The cytogenetics should be back but I do not have their paper report back from [facility]. I placed a phone call to the pathologist who is not in the office presently.

## 2012-09-14 ENCOUNTER — Encounter: Payer: Self-pay | Admitting: Oncology

## 2012-11-22 ENCOUNTER — Encounter (HOSPITAL_COMMUNITY): Payer: Medicare Other | Attending: Internal Medicine

## 2012-11-22 DIAGNOSIS — D472 Monoclonal gammopathy: Secondary | ICD-10-CM

## 2012-11-22 LAB — COMPREHENSIVE METABOLIC PANEL
Albumin: 3.8 g/dL (ref 3.5–5.2)
BUN: 16 mg/dL (ref 6–23)
Calcium: 9.5 mg/dL (ref 8.4–10.5)
Creatinine, Ser: 1.46 mg/dL — ABNORMAL HIGH (ref 0.50–1.35)
GFR calc Af Amer: 51 mL/min — ABNORMAL LOW (ref >90)
Glucose, Bld: 95 mg/dL (ref 70–99)
Total Protein: 7.4 g/dL (ref 6.0–8.3)

## 2012-11-22 LAB — CBC WITH DIFFERENTIAL/PLATELET
Basophils Relative: 1 % (ref 0–1)
Eosinophils Absolute: 0.1 10*3/uL (ref 0.0–0.7)
Eosinophils Relative: 3 % (ref 0–5)
Hemoglobin: 11.3 g/dL — ABNORMAL LOW (ref 13.0–17.0)
Lymphs Abs: 1 10*3/uL (ref 0.7–4.0)
MCH: 28.4 pg (ref 26.0–34.0)
MCHC: 32.6 g/dL (ref 30.0–36.0)
MCV: 87.2 fL (ref 78.0–100.0)
Monocytes Relative: 5 % (ref 3–12)
Platelets: 123 10*3/uL — ABNORMAL LOW (ref 150–400)
RBC: 3.98 MIL/uL — ABNORMAL LOW (ref 4.22–5.81)

## 2012-11-22 LAB — CREATININE CLEARANCE, URINE, 24 HOUR
Collection Interval-CRCL: 24 hours
Creatinine Clearance: 57 mL/min — ABNORMAL LOW (ref 75–125)
Creatinine, 24H Ur: 1197 mg/d (ref 800–2000)

## 2012-11-22 LAB — PROTEIN, URINE, 24 HOUR: Protein, Urine: 23 mg/dL

## 2012-11-22 NOTE — Progress Notes (Signed)
Labs drawn today for cbc/diff,cmp,kllc,mm panel,24 hr urine crea clearance, protein,Immunfixation

## 2012-11-23 LAB — KAPPA/LAMBDA LIGHT CHAINS
Kappa free light chain: 5.55 mg/dL — ABNORMAL HIGH (ref 0.33–1.94)
Kappa, lambda light chain ratio: 2.72 — ABNORMAL HIGH (ref 0.26–1.65)
Lambda free light chains: 2.04 mg/dL (ref 0.57–2.63)

## 2012-11-24 LAB — MULTIPLE MYELOMA PANEL, SERUM
Beta Globulin: 5 % (ref 4.7–7.2)
IgA: 354 mg/dL (ref 68–379)
IgG (Immunoglobin G), Serum: 1410 mg/dL (ref 650–1600)
M-Spike, %: 0.73 g/dL
Total Protein: 7.3 g/dL (ref 6.0–8.3)

## 2012-11-24 LAB — UIFE/LIGHT CHAINS/TP QN, 24-HR UR
Albumin, U: DETECTED
Alpha 1, Urine: DETECTED — AB
Alpha 2, Urine: DETECTED — AB
Free Kappa Lt Chains,Ur: 26.6 mg/dL — ABNORMAL HIGH (ref 0.14–2.42)
Free Kappa/Lambda Ratio: 55.42 ratio — ABNORMAL HIGH (ref 2.04–10.37)
Total Protein, Urine: 37.9 mg/dL

## 2012-11-30 ENCOUNTER — Encounter (HOSPITAL_BASED_OUTPATIENT_CLINIC_OR_DEPARTMENT_OTHER): Payer: Medicare Other | Admitting: Oncology

## 2012-11-30 ENCOUNTER — Encounter (HOSPITAL_COMMUNITY): Payer: Self-pay | Admitting: Oncology

## 2012-11-30 VITALS — BP 184/81 | HR 53 | Temp 97.7°F | Resp 16 | Wt 161.6 lb

## 2012-11-30 DIAGNOSIS — N289 Disorder of kidney and ureter, unspecified: Secondary | ICD-10-CM

## 2012-11-30 DIAGNOSIS — D472 Monoclonal gammopathy: Secondary | ICD-10-CM

## 2012-11-30 DIAGNOSIS — D696 Thrombocytopenia, unspecified: Secondary | ICD-10-CM

## 2012-11-30 DIAGNOSIS — D649 Anemia, unspecified: Secondary | ICD-10-CM

## 2012-11-30 NOTE — Patient Instructions (Addendum)
[facility] Discharge Instructions  RECOMMENDATIONS MADE BY THE CONSULTANT AND ANY TEST RESULTS WILL BE SENT TO YOUR REFERRING PHYSICIAN.  EXAM FINDINGS BY THE PHYSICIAN TODAY AND SIGNS OR SYMPTOMS TO REPORT TO CLINIC OR PRIMARY PHYSICIAN: Discussion by MD.  Diagosis is MGUS which is a benign and is excess protein in the blood.  MEDICATIONS PRESCRIBED:  none  INSTRUCTIONS GIVEN AND DISCUSSED: 24 hour urine collection and blood work in 6 months.  SPECIAL INSTRUCTIONS/FOLLOW-UP: Return for follow-up in 6 months after labs.  Thank you for choosing [facility] to provide your oncology and hematology care.  To afford each patient quality time with our providers, please arrive at least 15 minutes before your scheduled appointment time.  With your help, our goal is to use those 15 minutes to complete the necessary work-up to ensure our physicians have the information they need to help with your evaluation and healthcare recommendations.    Effective January 1st, 2014, we ask that you re-schedule your appointment with our physicians should you arrive 10 or more minutes late for your appointment.  We strive to give you quality time with our providers, and arriving late affects you and other patients whose appointments are after yours.    Again, thank you for choosing [facility].  Our hope is that these requests will decrease the amount of time that you wait before being seen by our physicians.       _____________________________________________________________  Should you have questions after your visit to [facility], please contact our office at [phone] between the hours of 8:30 a.m. and 5:00 p.m.  Voicemails left after 4:30 p.m. will not be returned until the following business day.  For prescription refill requests, have your pharmacy contact our office with your prescription refill request.     24-Hour Urine Collection HOME  CARE  When you get up in the morning on the day you do this test, pee (urinate) in the toilet and flush. Make a note of the time. This will be your start time on the day of collection and the end time on the next morning.  From then on, save all your pee (urine) in the plastic jug that was given to you.  You should stop collecting your pee 24 hours after you started.  If the plastic jug that is given to you already has liquid in it, that is okay. Do not throw out the liquid or rinse out the jug. Some tests need the liquid to be added to your pee.  Keep your plastic jug cool (in an ice chest or the refrigerator) during the test.  When the 24 hours is over, bring your plastic jug to the clinic lab. Keep the jug cool (in an ice chest) while you are bringing it to the lab. Document Released: 01/02/2009 Document Revised: 12/29/2011 Document Reviewed: 01/02/2009 [facility] Patient Information 2013 [place], [place].

## 2012-11-30 NOTE — Progress Notes (Signed)
MGUS with an IgG kappa monoclonal protein. He had a few increased numbers of plasma cells in his bone marrow biopsy, but no bone lesions, and only a small M spike which has essentially remained stable. He continues to have no hypercalcemia. He has stable mild renal insufficiency. Total protein is within the normal range. He remains mildly anemic. White count and platelets are stable as well though he also remains mildly thrombocytopenic with no obvious cause by bone marrow biopsy etc. this may be related to one of his medications potentially.  He does have a low serum IgM level. His IgG and IgA levels are within the normal range. His kappa and lambda light chains are about the same and indeed his lambda light chains are slightly less. His total urinary protein has actually gone down I think that correlates with his decrease in light chains especially of the lambda class. His kappa light chains are essentially the same. His creatinine clearance is approximately 55 mL per minute and remains stable around 57 mL per minute most recently.  We therefore need to just follow him with repeat blood work in 6 months. He brought his son along with her today and I answered a host of questions from him. He knows that right now he has a benign disorder which needs  followup.  The patient also asked about the number of medications he is on which we went over in some detail but if there are further questions he may need to contact his primary care physician.

## 2013-05-30 ENCOUNTER — Other Ambulatory Visit (HOSPITAL_COMMUNITY): Payer: Medicare Other

## 2013-06-01 ENCOUNTER — Ambulatory Visit (HOSPITAL_COMMUNITY): Payer: Medicare Other | Admitting: Oncology

## 2013-06-02 ENCOUNTER — Encounter (HOSPITAL_COMMUNITY): Payer: Medicare Other | Attending: Internal Medicine

## 2013-06-02 DIAGNOSIS — D472 Monoclonal gammopathy: Secondary | ICD-10-CM

## 2013-06-02 NOTE — Progress Notes (Signed)
Labs drawn today for mm panel,kllc

## 2013-06-03 LAB — KAPPA/LAMBDA LIGHT CHAINS: Kappa free light chain: 6.64 mg/dL — ABNORMAL HIGH (ref 0.33–1.94)

## 2013-06-06 ENCOUNTER — Encounter (HOSPITAL_COMMUNITY): Payer: Self-pay | Admitting: Oncology

## 2013-06-06 ENCOUNTER — Encounter (HOSPITAL_COMMUNITY): Payer: Medicare Other | Attending: Oncology | Admitting: Oncology

## 2013-06-06 VITALS — BP 177/76 | HR 60 | Temp 98.1°F | Resp 20 | Wt 142.2 lb

## 2013-06-06 DIAGNOSIS — I10 Essential (primary) hypertension: Secondary | ICD-10-CM

## 2013-06-06 DIAGNOSIS — N289 Disorder of kidney and ureter, unspecified: Secondary | ICD-10-CM

## 2013-06-06 DIAGNOSIS — D649 Anemia, unspecified: Secondary | ICD-10-CM

## 2013-06-06 DIAGNOSIS — C61 Malignant neoplasm of prostate: Secondary | ICD-10-CM

## 2013-06-06 DIAGNOSIS — M899 Disorder of bone, unspecified: Secondary | ICD-10-CM

## 2013-06-06 DIAGNOSIS — D472 Monoclonal gammopathy: Secondary | ICD-10-CM | POA: Insufficient documentation

## 2013-06-06 DIAGNOSIS — C189 Malignant neoplasm of colon, unspecified: Secondary | ICD-10-CM

## 2013-06-06 HISTORY — DX: Monoclonal gammopathy: D47.2

## 2013-06-06 LAB — CBC WITH DIFFERENTIAL/PLATELET
Eosinophils Relative: 2 % (ref 0–5)
HCT: 33.8 % — ABNORMAL LOW (ref 39.0–52.0)
Hemoglobin: 10.9 g/dL — ABNORMAL LOW (ref 13.0–17.0)
Lymphocytes Relative: 19 % (ref 12–46)
Lymphs Abs: 0.9 10*3/uL (ref 0.7–4.0)
MCV: 87.1 fL (ref 78.0–100.0)
Monocytes Absolute: 0.2 10*3/uL (ref 0.1–1.0)
Monocytes Relative: 5 % (ref 3–12)
Neutro Abs: 3.3 10*3/uL (ref 1.7–7.7)
WBC: 4.5 10*3/uL (ref 4.0–10.5)

## 2013-06-06 LAB — COMPREHENSIVE METABOLIC PANEL
AST: 17 U/L (ref 0–37)
BUN: 23 mg/dL (ref 6–23)
CO2: 28 mEq/L (ref 19–32)
Chloride: 110 mEq/L (ref 96–112)
Creatinine, Ser: 1.64 mg/dL — ABNORMAL HIGH (ref 0.50–1.35)
GFR calc Af Amer: 44 mL/min — ABNORMAL LOW (ref >90)
GFR calc non Af Amer: 38 mL/min — ABNORMAL LOW (ref >90)
Glucose, Bld: 105 mg/dL — ABNORMAL HIGH (ref 70–99)
Total Bilirubin: 0.4 mg/dL (ref 0.3–1.2)

## 2013-06-06 NOTE — Patient Instructions (Addendum)
[facility] Discharge Instructions  RECOMMENDATIONS MADE BY THE CONSULTANT AND ANY TEST RESULTS WILL BE SENT TO YOUR REFERRING PHYSICIAN.  EXAM FINDINGS BY THE PHYSICIAN TODAY AND SIGNS OR SYMPTOMS TO REPORT TO CLINIC OR PRIMARY PHYSICIAN:   CT scan of chest   Return in 6 months for labs and then to see MD    Thank you for choosing [facility] to provide your oncology and hematology care.  To afford each patient quality time with our providers, please arrive at least 15 minutes before your scheduled appointment time.  With your help, our goal is to use those 15 minutes to complete the necessary work-up to ensure our physicians have the information they need to help with your evaluation and healthcare recommendations.    Effective January 1st, 2014, we ask that you re-schedule your appointment with our physicians should you arrive 10 or more minutes late for your appointment.  We strive to give you quality time with our providers, and arriving late affects you and other patients whose appointments are after yours.    Again, thank you for choosing [facility].  Our hope is that these requests will decrease the amount of time that you wait before being seen by our physicians.       _____________________________________________________________  Should you have questions after your visit to [facility], please contact our office at [phone] between the hours of 8:30 a.m. and 5:00 p.m.  Voicemails left after 4:30 p.m. will not be returned until the following business day.  For prescription refill requests, have your pharmacy contact our office with your prescription refill request.

## 2013-06-06 NOTE — Progress Notes (Signed)
[name], MD [address] [place] [address]  MGUS (monoclonal gammopathy of unknown significance) - Plan: CBC with Differential, Comprehensive metabolic panel, CT Chest Wo Contrast Screening, Multiple myeloma panel, serum, Kappa/lambda light chains  Prostate cancer - Plan: PSA  Colon cancer - Plan: CEA  CURRENT THERAPY: Observation  INTERVAL HISTORY: Dominic Sandoval 79 y.o. male returns for  regular  visit for followup of MGUS with an IgG kappa monoclonal protein. He had a few increased numbers of plasma cells in his bone marrow biopsy, but no bone lesions, and only a small M spike which has essentially remained stable. He continues to have no hypercalcemia. He has stable mild renal insufficiency. Total protein is within the normal range. He remains mildly anemic. White count and platelets are stable as well though he also remains mildly thrombocytopenic with no obvious cause by bone marrow biopsy etc. this may be related to one of his medications potentially.  I personally reviewed and went over laboratory results with the patient.  His MM panel is pending from the other day.  We will continue to follow CRAB criteria for conversion to multiple myeloma from MDS.   C= Calcium increase of greater than 11.5 mg/dL R = Renal insufficiency with creatinine > 2 mg/dL A= Anemia with Hgb < 10 g/dL B= Bone disease with lytic lesions and/or osteopenia  Since most recent lab work is pending, labs from 6 months ago meet criteria for MGUS.  On further examination, it is noted that the patient has lost 20 lbs in 6 months.  This warrants a little work-up from a hem/onc stanpoint.  He does admit to nearly a 30 year pack history of smoking.  He reports that he used to smoke 1 ppd.  He admits to a cough.  When I review his chart, his last CT of chest was last year and showed 2 small benign appearing lesions.  With his recent weight gain, it is reasonable to perform a CT of chest to  rule out occult malignancy.  He does have alzheimer's and this may be contributing to his weight loss, but I think a further evaluation is warranted.   He also reports intermittent chest pain.  He denies any pattern to this discomfort.  He denies any increased symptoms on exertion.  He denies any changes with rest.  He denies any diaphoresis or shortness of breath during times of chest pain.  I asked him to follow-up with his PCP regarding this discomfort as it does not sound like typical angina.   His BP is also noted to be elevated.  He is on Lisinopril and Norvasc.  I recommended he follow-up with his PCP regarding this as well.   Hematologically, he denies any complaints and ROS questioning is negative.     Past Medical History  Diagnosis Date  . Arteriosclerotic cardiovascular disease (ASCVD)     nonobstructive; 50% LAD in 12/2007, mild pulmonary hypertension, elevated PCW; 7/09-normal EF;   h/o CHF in 2/09 with EF of 35-40% on echo; EF of 35-40% in early 2009, but nl in 7/09; 3/09-mild to mod. CAD; 50% LAD; 30% RCA/CX  . Sinus node dysfunction     has not tolerated beta blocker; rate as low as 34 on Holter in 2010; asymptomatic  . Prostate cancer 2004    h/o benign prostatic hypertrophy; treated with external RT in 2004  . Diabetes mellitus, type 2     Reason CBGs(2013) normal in the absence of  treatment  . Gastroparesis     Mild, 30% retained contents at 2 hours  . Normocytic anemia     attributed to chronic kidney disease  . Cholelithiasis   . Colonic adenoma 2008, 2011    TCS and polypectomy 2008, 2011 with Hemoccult positive stool  . Hyperlipidemia   . Hypertension   . Colon cancer 1990    right hemicolectomy-1990  . Chronic kidney disease     Cr-1.73 in 06/2008;  1.3 in 07/2010  . Gout   . Pulmonary nodule 10/2007    Left lower lobe, 5 mm, subpleural 10/2007  . Anemia   . Heavy chain disease, IgG type 07/30/2012  . MGUS (monoclonal gammopathy of unknown significance)  06/06/2013    has GOUT; Gastroparesis; Cerebrovascular disease; Sinus node dysfunction; Prostate cancer; Normocytic anemia; Hyperlipidemia; Hypertension; Chronic kidney disease; Arteriosclerotic cardiovascular disease (ASCVD); Abnormal weight loss; Colon cancer; Diabetes mellitus, type 2; Anemia; and MGUS (monoclonal gammopathy of unknown significance) on his problem list.     has No Known Allergies.  Dominic Sandoval does not currently have medications on file.  Past Surgical History  Procedure Laterality Date  . Hemicolectomy  1990    Right; carcinoma  . Esophagogastroduodenoscopy  04/19/2007    Normal esophagus without evidence of Barrett's,/ Normal stomach without evidence of erosions/ Normal duodenal  . Colonoscopy w/ polypectomy   05/04/2007    A 6 mm transverse colon polyp removed/A 3 mm transverse colon polyp removed /  An 8 mm sessile sigmoid colon polyp removed   . Colonoscopy  07/23/2010    adenomas/internal hemorrhoids  . Prostate biopsy    . Esophagogastroduodenoscopy    . Esophagogastroduodenoscopy  07/27/2012    Procedure: ESOPHAGOGASTRODUODENOSCOPY (EGD);  Surgeon: West Bali, MD;  Location: [facility];  Service: Endoscopy;  Laterality: N/A;  11:40  . Bone marrow aspirate and biopsy wiith lumbar puncture  08/10/12    Denies any headaches, dizziness, double vision, fevers, chills, night sweats, nausea, vomiting, diarrhea, constipation, shortness of breath, blood in stool, black tarry stool, urinary pain, urinary burning, urinary frequency.   PHYSICAL EXAMINATION  ECOG PERFORMANCE STATUS: 1 - Symptomatic but completely ambulatory  Filed Vitals:   06/06/13 1140  BP:   Pulse: 60  Temp:   Resp:     GENERAL:alert, no distress, cachectic, comfortable, cooperative and smiling SKIN: skin color, texture, turgor are normal, no rashes or significant lesions HEAD: Normocephalic, No masses, lesions, tenderness or abnormalities EYES: normal, PERRLA, EOMI, Conjunctiva are  pink and non-injected EARS: External ears normal OROPHARYNX:mucous membranes are moist  NECK: supple, no adenopathy, thyroid normal size, non-tender, without nodularity, no stridor, non-tender, trachea midline LYMPH:  no palpable lymphadenopathy, no hepatosplenomegaly BREAST:not examined LUNGS: clear to auscultation and percussion, decreased breath sounds and hyperresonant to percussion. HEART: regular rate & rhythm, no murmurs, no gallops, S1 normal and S2 normal ABDOMEN:abdomen soft, non-tender, normal bowel sounds, no masses or organomegaly and no hepatosplenomegaly BACK: Back symmetric, no curvature., No CVA tenderness EXTREMITIES:less then 2 second capillary refill, no joint deformities, effusion, or inflammation, no edema, no skin discoloration, no clubbing, no cyanosis  NEURO: alert & oriented x 3 with fluent speech, no focal motor/sensory deficits, gait normal    LABORATORY DATA: CBC    Component Value Date/Time   WBC 4.5 06/06/2013 1234   RBC 3.88* 06/06/2013 1234   RBC 3.70* 06/25/2012 1113   HGB 10.9* 06/06/2013 1234   HCT 33.8* 06/06/2013 1234   PLT 126* 06/06/2013 1234  MCV 87.1 06/06/2013 1234   MCH 28.1 06/06/2013 1234   MCHC 32.2 06/06/2013 1234   RDW 15.0 06/06/2013 1234   LYMPHSABS 0.9 06/06/2013 1234   MONOABS 0.2 06/06/2013 1234   EOSABS 0.1 06/06/2013 1234   BASOSABS 0.0 06/06/2013 1234      Chemistry      Component Value Date/Time   NA 148* 06/06/2013 1234   K 3.9 06/06/2013 1234   CL 110 06/06/2013 1234   CO2 28 06/06/2013 1234   BUN 23 06/06/2013 1234   CREATININE 1.64* 06/06/2013 1234   CREATININE 1.46* 11/22/2012 1012   CREATININE 1.57* 06/22/2012 0931      Component Value Date/Time   CALCIUM 9.8 06/06/2013 1234   ALKPHOS 73 06/06/2013 1234   AST 17 06/06/2013 1234   ALT 7 06/06/2013 1234   BILITOT 0.4 06/06/2013 1234       PENDING LABS: MM panel, CEA,  PSA,    RADIOGRAPHIC STUDIES:  06/30/2013  *RADIOLOGY REPORT*  Clinical Data: Pulmonary nodule.  CT  CHEST WITHOUT CONTRAST  Technique: Multidetector CT imaging of the chest was performed  following the standard protocol without IV contrast.  Comparison: 12/13/2007  Findings: There is no axillary lymphadenopathy. No mediastinal or  hilar lymphadenopathy is visible on this uninfused study. The  heart size is normal. Coronary artery calcification is noted. No  pericardial or pleural effusion.  Lungs are hyperexpanded, as before. The collapse / consolidation  and pleural effusions seen on the previous study have resolved in  the interval. No focal airspace consolidation on today's exam.  The 4 mm right lower lobe pulmonary nodule seen on the previous  study is visible on image 36 today. It is unchanged. A 3 mm  subpleural nodule in the posterolateral left lower lobe on image 42  is also unchanged.  Images which include the upper abdomen show a 1.4 cm calcified  stone in the gallbladder. 3.0 cm water attenuating lesion in the  upper pole of the right kidney is probably a cyst.  Bone windows reveal no worrisome lytic or sclerotic osseous  lesions.  IMPRESSION:  Tiny bilateral pulmonary nodules, unchanged since 12/13/2007.  Findings are consistent with benign disease.  Cholelithiasis.  Right renal cyst.  Original Report Authenticated By: ERIC A. MANSELL, M.D.     ASSESSMENT:  1. MGUS  with an IgG kappa monoclonal protein. 2. Weight loss, 20 lbs x 6 months 3. Atypical angina 4. HTN 5. H/O tobacco abuse  Patient Active Problem List   Diagnosis Date Noted  . MGUS (monoclonal gammopathy of unknown significance) 06/06/2013  . Anemia 06/25/2012  . Colon cancer   . Diabetes mellitus, type 2   . Abnormal weight loss 02/10/2012  . Sinus node dysfunction   . Prostate cancer   . Normocytic anemia   . Hyperlipidemia   . Hypertension   . Chronic kidney disease   . Arteriosclerotic cardiovascular disease (ASCVD)   . Cerebrovascular disease 08/09/2010  . Gastroparesis 01/17/2009  .  GOUT 12/17/2006      PLAN:  1. I personally reviewed and went over laboratory results with the patient. 2. I personally reviewed and went over radiographic results with the patient. 3. Labs today: CBC diff, CMET, PSA, CEA 4. MM panel pending 5. CT of chest without contrast to evaluate for occult malignancy in light of recent, significant, weight loss. 6. Recommend follow-up with PCP for HTN and atypical angina 7. Return in 6 months for follow-up, but we will see him sooner if  work-up is impressive.    THERAPY PLAN:  From a MGUS standpoint, he appears stable, however MM panel is pending at time of this dictation.  Will follow CRAB criteria for surveillance of MM transformation. Will work-up recent weight loss with CT of chest without contrast due to smoking history and lung nodules noted in past.    All questions were answered. The patient knows to call the clinic with any problems, questions or concerns. We can certainly see the patient much sooner if necessary.  Patient and plan discussed with Dr. Erline Hau and he is in agreement with the aforementioned.   KEFALAS,[name]

## 2013-06-07 LAB — PSA: PSA: 0.54 ng/mL (ref <=4.00)

## 2013-06-07 LAB — CEA: CEA: 1.4 ng/mL (ref 0.0–5.0)

## 2013-06-08 LAB — MULTIPLE MYELOMA PANEL, SERUM
Gamma Globulin: 17.5 % (ref 11.1–18.8)
IgG (Immunoglobin G), Serum: 1690 mg/dL — ABNORMAL HIGH (ref 650–1600)
M-Spike, %: 0.62 g/dL

## 2013-06-21 ENCOUNTER — Other Ambulatory Visit (HOSPITAL_COMMUNITY): Payer: Self-pay | Admitting: Oncology

## 2013-06-21 ENCOUNTER — Ambulatory Visit (HOSPITAL_COMMUNITY)
Admission: RE | Admit: 2013-06-21 | Discharge: 2013-06-21 | Disposition: A | Discharge status: Home / Self Care | Payer: Medicare Other | Source: Ambulatory Visit | Attending: Oncology | Admitting: Oncology

## 2013-06-21 DIAGNOSIS — R9389 Abnormal findings on diagnostic imaging of other specified body structures: Secondary | ICD-10-CM

## 2013-06-21 DIAGNOSIS — D472 Monoclonal gammopathy: Secondary | ICD-10-CM

## 2013-06-21 DIAGNOSIS — R05 Cough: Secondary | ICD-10-CM | POA: Insufficient documentation

## 2013-06-21 DIAGNOSIS — R634 Abnormal weight loss: Secondary | ICD-10-CM | POA: Insufficient documentation

## 2013-06-21 DIAGNOSIS — R059 Cough, unspecified: Secondary | ICD-10-CM | POA: Insufficient documentation

## 2013-07-04 ENCOUNTER — Encounter: Payer: Self-pay | Admitting: Oncology

## 2013-07-05 ENCOUNTER — Other Ambulatory Visit (HOSPITAL_COMMUNITY): Payer: Medicare Other

## 2013-07-05 ENCOUNTER — Ambulatory Visit (HOSPITAL_COMMUNITY): Admission: RE | Admit: 2013-07-05 | Payer: Medicare Other | Source: Ambulatory Visit

## 2013-08-22 ENCOUNTER — Ambulatory Visit: Payer: Medicare Other | Admitting: Cardiology

## 2013-08-25 ENCOUNTER — Ambulatory Visit: Payer: Medicare Other | Admitting: Cardiology

## 2013-09-26 ENCOUNTER — Other Ambulatory Visit: Payer: Self-pay

## 2013-09-26 MED ORDER — AMLODIPINE BESYLATE 10 MG PO TABS: ORAL_TABLET | ORAL | Status: DC

## 2013-10-07 ENCOUNTER — Ambulatory Visit (INDEPENDENT_AMBULATORY_CARE_PROVIDER_SITE_OTHER): Payer: Medicare Other | Admitting: Cardiology

## 2013-10-07 VITALS — BP 186/90 | HR 53 | Ht 75.0 in

## 2013-10-07 DIAGNOSIS — R001 Bradycardia, unspecified: Secondary | ICD-10-CM

## 2013-10-07 DIAGNOSIS — I10 Essential (primary) hypertension: Secondary | ICD-10-CM

## 2013-10-07 DIAGNOSIS — I2510 Atherosclerotic heart disease of native coronary artery without angina pectoris: Secondary | ICD-10-CM

## 2013-10-07 DIAGNOSIS — I498 Other specified cardiac arrhythmias: Secondary | ICD-10-CM

## 2013-10-07 DIAGNOSIS — E785 Hyperlipidemia, unspecified: Secondary | ICD-10-CM

## 2013-10-07 MED ORDER — AMLODIPINE BESYLATE 10 MG PO TABS: ORAL_TABLET | ORAL | Status: DC

## 2013-10-07 NOTE — Progress Notes (Signed)
Clinical Summary Mr. Pho is a 79 y.o.male former patient of Dr Dietrich Pates, this is our first visit together.   1. Non-obstructive CAD - prior cath 2009 showed non-obstructive disease, 50% LAD lesion - there is mention of a previously low LVEF in 11/2007 that normalized in 04/2008, I do not see the actual studies in our system.  - has not tolerated beta blockers due to bradycardia - denies any chest pain. Denies any SOB or DOE. Fairly sedentary lifestyle. No orthopnea, no PND, occas LE edema  2. Hyperlipidemia - reports compliance with statin - 06/2012 TC 162 HDL 51 LDL 104  3. HTN - does not check regularly - mixed compliance with meds  4. MGUS - followed by heme/onc  5. Sinus bradycardia - noted during prior visits - denies any symptoms, no lightheadedness, dizziness, syncope, or fatigue.   6. Leg pain - reports several month history of bilateral calf pain and swelling, occurs at rest mainly  Past Medical History  Diagnosis Date  . Arteriosclerotic cardiovascular disease (ASCVD)     nonobstructive; 50% LAD in 12/2007, mild pulmonary hypertension, elevated PCW; 7/09-normal EF;   h/o CHF in 2/09 with EF of 35-40% on echo; EF of 35-40% in early 2009, but nl in 7/09; 3/09-mild to mod. CAD; 50% LAD; 30% RCA/CX  . Sinus node dysfunction     has not tolerated beta blocker; rate as low as 34 on Holter in 2010; asymptomatic  . Prostate cancer 2004    h/o benign prostatic hypertrophy; treated with external RT in 2004  . Diabetes mellitus, type 2     Reason CBGs(2013) normal in the absence of treatment  . Gastroparesis     Mild, 30% retained contents at 2 hours  . Normocytic anemia     attributed to chronic kidney disease  . Cholelithiasis   . Colonic adenoma 2008, 2011    TCS and polypectomy 2008, 2011 with Hemoccult positive stool  . Hyperlipidemia   . Hypertension   . Colon cancer 1990    right hemicolectomy-1990  . Chronic kidney disease     Cr-1.73 in 06/2008;  1.3 in  07/2010  . Gout   . Pulmonary nodule 10/2007    Left lower lobe, 5 mm, subpleural 10/2007  . Anemia   . Heavy chain disease, IgG type 07/30/2012  . MGUS (monoclonal gammopathy of unknown significance) 06/06/2013     No Known Allergies   Current Outpatient Prescriptions  Medication Sig Dispense Refill  . ALPRAZolam (XANAX) 0.25 MG tablet Take 1 tablet PO the AM of the procedure with breakfast.  May take another upon arrival the AM of the procedure if needed. May take 1 tablet every 6 hours PRN anxiety following procedure  3 tablet  0  . amLODipine (NORVASC) 10 MG tablet take 1 tablet by mouth once daily  30 tablet  1  . donepezil (ARICEPT) 10 MG tablet Take 10 mg by mouth at bedtime.       Marland Kitchen doxazosin (CARDURA) 8 MG tablet take 1 tablet by mouth at bedtime  30 tablet  11  . HYDROcodone-acetaminophen (NORCO/VICODIN) 5-325 MG per tablet Take 1 tablet, PO the AM of the procedure with breakfast.  May take 1 tablet, PO every 6 hours PRN pain following procedure  5 tablet  0  . lisinopril (PRINIVIL,ZESTRIL) 20 MG tablet Take 20 mg by mouth 2 (two) times daily.       . polyethylene glycol powder (GLYCOLAX/MIRALAX) powder 17 g as needed.       Marland Kitchen  simvastatin (ZOCOR) 20 MG tablet Take 20 mg by mouth at bedtime.        No current facility-administered medications for this visit.     Past Surgical History  Procedure Laterality Date  . Hemicolectomy  1990    Right; carcinoma  . Esophagogastroduodenoscopy  04/19/2007    Normal esophagus without evidence of Barrett's,/ Normal stomach without evidence of erosions/ Normal duodenal  . Colonoscopy w/ polypectomy   05/04/2007    A 6 mm transverse colon polyp removed/A 3 mm transverse colon polyp removed /  An 8 mm sessile sigmoid colon polyp removed   . Colonoscopy  07/23/2010    adenomas/internal hemorrhoids  . Prostate biopsy    . Esophagogastroduodenoscopy    . Esophagogastroduodenoscopy  07/27/2012    Procedure: ESOPHAGOGASTRODUODENOSCOPY (EGD);   Surgeon: West Bali, MD;  Location: [facility];  Service: Endoscopy;  Laterality: N/A;  11:40  . Bone marrow aspirate and biopsy wiith lumbar puncture  08/10/12     No Known Allergies    Family History  Problem Relation Age of Onset  . Colon cancer Father 39  . Hypertension Sister      Social History Mr. Tweten reports that he has quit smoking. He has never used smokeless tobacco. Mr. Thielke reports that he does not drink alcohol.   Review of Systems CONSTITUTIONAL: No weight loss, fever, chills, weakness or fatigue.  HEENT: Eyes: No visual loss, blurred vision, double vision or yellow sclerae.No hearing loss, sneezing, congestion, runny nose or sore throat.  SKIN: No rash or itching.  CARDIOVASCULAR: per HPI RESPIRATORY: No shortness of breath, cough or sputum.  GASTROINTESTINAL: No anorexia, nausea, vomiting or diarrhea. No abdominal pain or blood.  GENITOURINARY: No burning on urination, no polyuria NEUROLOGICAL: No headache, dizziness, syncope, paralysis, ataxia, numbness or tingling in the extremities. No change in bowel or bladder control.  MUSCULOSKELETAL: No muscle, back pain, joint pain or stiffness.  LYMPHATICS: No enlarged nodes. No history of splenectomy.  PSYCHIATRIC: No history of depression or anxiety.  ENDOCRINOLOGIC: No reports of sweating, cold or heat intolerance. No polyuria or polydipsia.  Marland Kitchen   Physical Examination p 53 bp 186/90 Gen: resting comfortably, no acute distress HEENT: no scleral icterus, pupils equal round and reactive, no palptable cervical adenopathy,  CV: RRR, no m/r/g, no JVD, no carotid bruits Resp: Clear to auscultation bilaterally GI: abdomen is soft, non-tender, non-distended, normal bowel sounds, no hepatosplenomegaly MSK: extremities are warm, no edema.  Skin: warm, no rash Neuro:  no focal deficits Psych: appropriate affect   Diagnostic Studies Cath 01/2008 1. Right atrial pressure was 7-8 mean.  2. RV 48/12.  3.  Pulmonary artery 37/15, mean 23.  4. Pulmonary capillary wedge 17 mean. At simultaneous LV wedge, the  wedge was 23 with V-waves ranging from 40-45.  5. LV 193/33.  6. Aortic 191/72 mean 120.  7. Thermodilution cardiac output 4.6 liters per minute.  8. Fick cardiac index 2.2 liters per minute per m2.  9. Dilution cardiac output 6.7 liters per minute.  10.Thermodilution cardiac index 3.2 liters per minute per m2.  11.Aortic saturation 94%.  12.Superior vena cava saturation 69%.  13.Pulmonary artery saturation 70%.    ANGIOGRAPHIC DATA:  1. On plain fluoroscopy, there was evidence of diffuse calcification  but no high-grade critical obstruction.  2. The ventriculogram and aortic root aortography were not performed  as noted above.  3. Calcification was noted in the coronary arteries as noted above.  4. Left main was  free of critical disease.  5. The LAD coursed to the apex. There is diffuse calcification in the  LAD. In the midvessel is probably about 40-50% area of focal  calcified stenosis. Likewise, more distally after a third  diagonal, there is 30-40% narrowing with calcified stenosis as  well. There are several small diagonal branches all of which have  ostial disease that is hard to grade due to the very small caliber  of the diagonal vessels themselves. These are not amenable to  treatment.  6. There is a ramus intermedius that is large and has about 30%  narrowing in its midportion.  7. There is an AV circumflex that is large and has about 30% narrowing  in its proximal portion and is calcified.  8. The right coronary artery provides posterior descending and  posterolateral system and there is some diffuse luminal  irregularity with about 30% mid-narrowing.   CONCLUSIONS:  1. Noncritical coronary artery disease.  2. Pulmonary hypertension with elevated left ventricular end diastolic  pressure and fairly prominent V-wave.   RECOMMENDATIONS:  1. Aggressive blood  pressure control.  2. Follow-up with Dr. Dietrich Pates.  3. Consideration of treatment of anemia and evaluation as such.  4. Question of whether or not TEE for evaluation of the mitral valve  for structural disease is warranted versus careful blood pressure  control. This will be determined by Dr. Dietrich Pates during follow-up  visits.    10/07/13 EKG Sinus bradycarida  Assessment and Plan  1. Non-obstructive CAD - no current symptoms - continue risk factor modification, start ASA 81 mg daily  2. Hyperlipidemia - he is due for a physical in the next few weeks, follow up lipid panel - continue current statin  3. HTN - elevated bp today, reports he has not taken his medications - encouraged medication compliance, especially in setting of CKD  4. Sinus bradycardia - asymptomatic, continue to follow clinically - avoid AV nodal blocking agents.   5. Leg pain - check bilateral LE venous dopplers   Antoine Poche, M.D., F.A.C.C.

## 2013-10-07 NOTE — Patient Instructions (Signed)
Your physician wants you to follow-up in: ONE YEAR You will receive a reminder letter in the mail two months in advance. If you don't receive a letter, please call our office to schedule the follow-up appointment.  Your physician has recommended you make the following change in your medication:   1) START ASPIRIN 81MG  ONCE DAILY  Your physician has requested that you have a lower or upper extremity venous duplex. This test is an ultrasound of the veins in the legs or arms. It looks at venous blood flow that carries blood from the heart to the legs or arms. Allow one hour for a Lower Venous exam. Allow thirty minutes for an Upper Venous exam. There are no restrictions or special instructions.   WE WILL CALL YOU WITH YOUR TEST RESULTS/INSTRUCTIONS/NEXT STEPS ONCE RECEIVED BY THE PROVIDER

## 2013-10-13 IMAGING — CT CT CHEST NODULE FOLLOW UP LOW DOSE W/O CM
1 of 3 series · 4 of 36 positions shown, 5 images · non-contrast
Comparison: Chest CT 06/30/2012.

CLINICAL DATA: 30 pack year history.  Cough.  20 pound weight loss.

CT CHEST SCREENING WITHOUT CONTRAST
TECHNIQUE: Multidetector CT imaging of the chest was performed
following the standard low-dose protocol without IV contrast.

[Series 4: mpr coro 3mm · coronal · 0.65mm/px · 4 of 74 slices shown, 5 images]
[im 15/74  mediastinal]
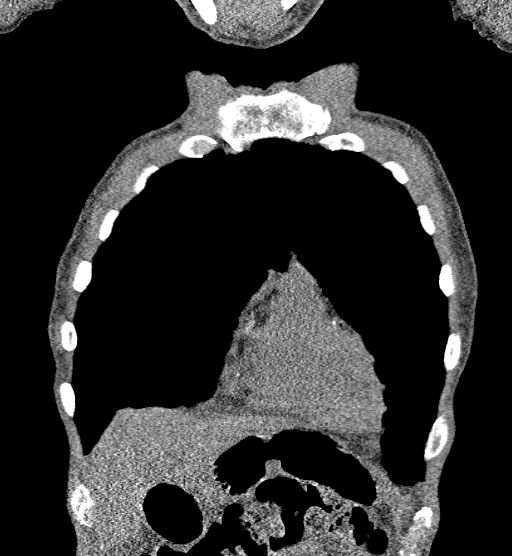
[im 15/74  lung]
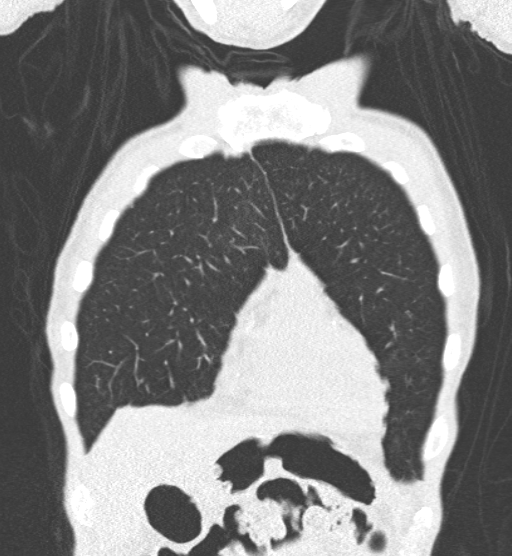
[im 30/74  lung]
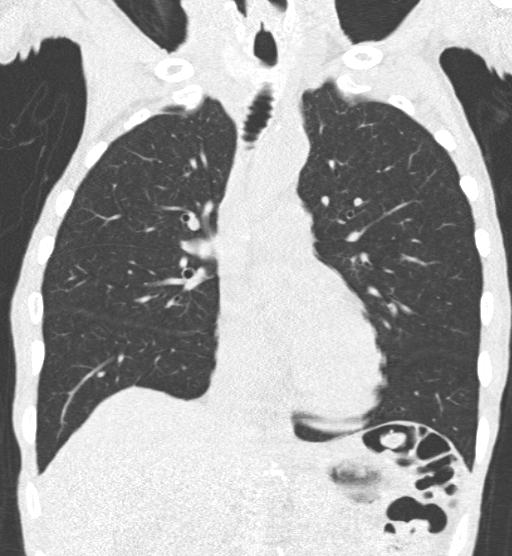
[im 44/74  lung]
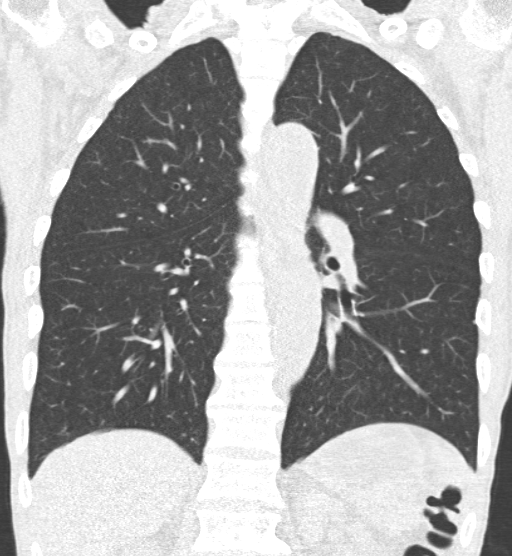
[im 59/74  lung]
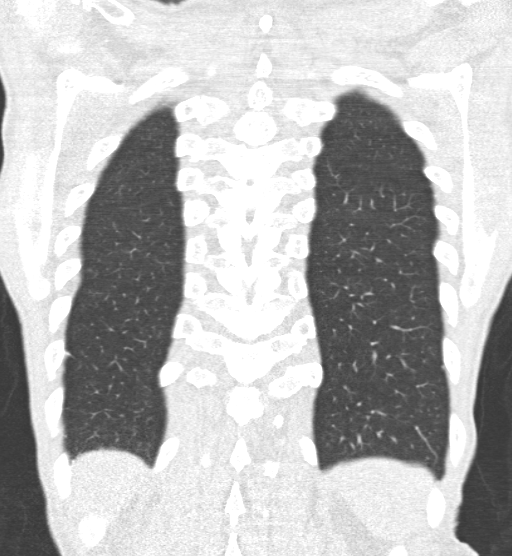

[4 of 36 positions shown; findings below may reference images not displayed]

FINDINGS: Mediastinum: Heart size is normal. There is no significant
pericardial fluid, thickening or pericardial calcification. There
is atherosclerosis of the thoracic aorta, the great vessels of the
mediastinum and the coronary arteries, including calcified
atherosclerotic plaque in the left main, left anterior descending,
left circumflex and right coronary arteries. No pathologically
enlarged mediastinal or hilar lymph nodes. Please note that
accurate exclusion of hilar adenopathy is limited on noncontrast CT
scans.  Esophagus is unremarkable in appearance.

Lungs/Pleura: 3 mm subpleural nodule in the lateral aspect of the
left lower lobe (image 44 of series 3) is unchanged compared to the
prior examination 06/30/2012, as well as a more remote prior study
from 12/13/2007.  Likewise, there is a 3 mm subpleural nodule in
the periphery of the lateral segment of the right middle lobe
(previously referred to as right lower lobe nodule) on image 41 of
series 3 which is unchanged.  No new suspicious appearing pulmonary
nodules or masses are otherwise identified.  No acute consolidative
airspace disease.  No pleural effusions.

Upper Abdomen: Low attenuation lesion in the upper pole of the
right kidney measuring at least 3.5 cm in diameter is incompletely
visualized, but is statistically likely a cyst and is similar to
the prior examination.  However, there is also a there is also an
incompletely visualized 3.6 x 3.1 cm lesion in the anterior aspect
of the interpolar region of the left kidney which is apparently
larger than prior examination from 04/21/2007.

Musculoskeletal: There are no aggressive appearing lytic or blastic
lesions noted in the visualized portions of the skeleton.
IMPRESSION: 1.  No suspicious appearing pulmonary nodules or masses are
identified.
2.  3 mm subpleural nodules in the lateral segment of the right
middle lobe and lateral aspect of the left lower lobe are unchanged
compared to remote prior CT examination from 12/13/2007, and can be
considered radiographically benign requiring no imaging follow-up.
3.  Bilateral renal lesions, as above, incompletely characterized
on today's examination.  The lesion in the anterior aspect of the
interpolar region of the left kidney is incompletely visualized but
has clearly grown compared to remote prior examinations.  Further
evaluation with renal ultrasound is recommended to exclude the
possibility of a cystic renal neoplasm.
4. Atherosclerosis, including left main and three-vessel coronary
artery disease.

## 2013-11-09 ENCOUNTER — Other Ambulatory Visit: Payer: Self-pay

## 2013-11-09 MED ORDER — DOXAZOSIN MESYLATE 8 MG PO TABS: ORAL_TABLET | ORAL | Status: DC

## 2013-11-16 ENCOUNTER — Telehealth: Payer: Self-pay | Admitting: Cardiology

## 2013-11-16 MED ORDER — DOXAZOSIN MESYLATE 8 MG PO TABS: ORAL_TABLET | ORAL | Status: AC

## 2013-11-16 MED ORDER — AMLODIPINE BESYLATE 10 MG PO TABS: ORAL_TABLET | ORAL | Status: AC

## 2013-11-16 NOTE — Telephone Encounter (Signed)
rx sent to pharmacy by e-script for amlodipine and doxazosin per noted pt is not on Digoxin

## 2013-11-16 NOTE — Telephone Encounter (Signed)
Needs Digoxin and Amlodipine sent to [facility] in [place] / [facility]

## 2013-11-28 ENCOUNTER — Other Ambulatory Visit (HOSPITAL_COMMUNITY): Payer: Medicare Other

## 2013-11-30 ENCOUNTER — Other Ambulatory Visit (HOSPITAL_COMMUNITY): Payer: Medicare Other

## 2013-12-05 ENCOUNTER — Other Ambulatory Visit (HOSPITAL_COMMUNITY): Payer: Medicare Other

## 2013-12-06 NOTE — Progress Notes (Signed)
-  Rescheduled-  Yoskar Murrillo  

## 2013-12-07 ENCOUNTER — Ambulatory Visit (HOSPITAL_COMMUNITY): Payer: Medicare Other | Admitting: Oncology

## 2014-01-02 NOTE — Progress Notes (Addendum)
-  Patient decreased-  Dominic Sandoval 01/05/2015 9:34 AM

## 2014-01-04 ENCOUNTER — Encounter (HOSPITAL_COMMUNITY): Payer: Medicare Other | Attending: Oncology | Admitting: Oncology

## 2014-01-04 ENCOUNTER — Encounter (HOSPITAL_BASED_OUTPATIENT_CLINIC_OR_DEPARTMENT_OTHER): Payer: Medicare Other

## 2014-01-04 VITALS — BP 220/91 | HR 50 | Resp 18 | Wt 176.2 lb

## 2014-01-04 DIAGNOSIS — D472 Monoclonal gammopathy: Secondary | ICD-10-CM

## 2014-01-04 DIAGNOSIS — D631 Anemia in chronic kidney disease: Secondary | ICD-10-CM | POA: Insufficient documentation

## 2014-01-04 DIAGNOSIS — D649 Anemia, unspecified: Secondary | ICD-10-CM

## 2014-01-04 DIAGNOSIS — N189 Chronic kidney disease, unspecified: Secondary | ICD-10-CM

## 2014-01-04 DIAGNOSIS — Z09 Encounter for follow-up examination after completed treatment for conditions other than malignant neoplasm: Secondary | ICD-10-CM | POA: Insufficient documentation

## 2014-01-04 DIAGNOSIS — Z85038 Personal history of other malignant neoplasm of large intestine: Secondary | ICD-10-CM | POA: Insufficient documentation

## 2014-01-04 DIAGNOSIS — Z923 Personal history of irradiation: Secondary | ICD-10-CM | POA: Insufficient documentation

## 2014-01-04 DIAGNOSIS — I129 Hypertensive chronic kidney disease with stage 1 through stage 4 chronic kidney disease, or unspecified chronic kidney disease: Secondary | ICD-10-CM | POA: Insufficient documentation

## 2014-01-04 DIAGNOSIS — E119 Type 2 diabetes mellitus without complications: Secondary | ICD-10-CM | POA: Insufficient documentation

## 2014-01-04 DIAGNOSIS — D696 Thrombocytopenia, unspecified: Secondary | ICD-10-CM

## 2014-01-04 DIAGNOSIS — N039 Chronic nephritic syndrome with unspecified morphologic changes: Secondary | ICD-10-CM

## 2014-01-04 DIAGNOSIS — C61 Malignant neoplasm of prostate: Secondary | ICD-10-CM

## 2014-01-04 DIAGNOSIS — C189 Malignant neoplasm of colon, unspecified: Secondary | ICD-10-CM

## 2014-01-04 DIAGNOSIS — Z8546 Personal history of malignant neoplasm of prostate: Secondary | ICD-10-CM

## 2014-01-04 LAB — CBC WITH DIFFERENTIAL/PLATELET
BASOS PCT: 0 % (ref 0–1)
Basophils Absolute: 0 10*3/uL (ref 0.0–0.1)
EOS ABS: 0.2 10*3/uL (ref 0.0–0.7)
Eosinophils Relative: 4 % (ref 0–5)
HEMATOCRIT: 33.1 % — AB (ref 39.0–52.0)
HEMOGLOBIN: 10.5 g/dL — AB (ref 13.0–17.0)
Lymphocytes Relative: 18 % (ref 12–46)
Lymphs Abs: 0.9 10*3/uL (ref 0.7–4.0)
MCH: 28.2 pg (ref 26.0–34.0)
MCHC: 31.7 g/dL (ref 30.0–36.0)
MCV: 88.7 fL (ref 78.0–100.0)
MONO ABS: 0.3 10*3/uL (ref 0.1–1.0)
MONOS PCT: 7 % (ref 3–12)
NEUTROS PCT: 71 % (ref 43–77)
Neutro Abs: 3.7 10*3/uL (ref 1.7–7.7)
Platelets: 126 10*3/uL — ABNORMAL LOW (ref 150–400)
RBC: 3.73 MIL/uL — ABNORMAL LOW (ref 4.22–5.81)
RDW: 14.5 % (ref 11.5–15.5)
WBC: 5.1 10*3/uL (ref 4.0–10.5)

## 2014-01-04 LAB — COMPREHENSIVE METABOLIC PANEL
ALT: 9 U/L (ref 0–53)
AST: 21 U/L (ref 0–37)
Albumin: 3.5 g/dL (ref 3.5–5.2)
Alkaline Phosphatase: 100 U/L (ref 39–117)
BUN: 25 mg/dL — AB (ref 6–23)
CO2: 29 mEq/L (ref 19–32)
Calcium: 9.6 mg/dL (ref 8.4–10.5)
Chloride: 103 mEq/L (ref 96–112)
Creatinine, Ser: 1.44 mg/dL — ABNORMAL HIGH (ref 0.50–1.35)
GFR calc Af Amer: 51 mL/min — ABNORMAL LOW (ref >90)
GFR calc non Af Amer: 44 mL/min — ABNORMAL LOW (ref >90)
Glucose, Bld: 86 mg/dL (ref 70–99)
Potassium: 4.3 mEq/L (ref 3.7–5.3)
Sodium: 142 mEq/L (ref 137–147)
TOTAL PROTEIN: 7.7 g/dL (ref 6.0–8.3)
Total Bilirubin: 0.4 mg/dL (ref 0.3–1.2)

## 2014-01-04 NOTE — Patient Instructions (Addendum)
Dominic Sandoval Discharge Instructions  RECOMMENDATIONS MADE BY THE CONSULTANT AND ANY TEST RESULTS WILL BE SENT TO YOUR REFERRING PHYSICIAN.  EXAM FINDINGS BY THE PHYSICIAN TODAY AND SIGNS OR SYMPTOMS TO REPORT TO CLINIC OR PRIMARY PHYSICIAN:   Labs in 6 months   Labs in 12 months and to see MD    Thank you for choosing [facility] to provide your oncology and hematology care.  To afford each patient quality time with our providers, please arrive at least 15 minutes before your scheduled appointment time.  With your help, our goal is to use those 15 minutes to complete the necessary work-up to ensure our physicians have the information they need to help with your evaluation and healthcare recommendations.    Effective January 1st, 2014, we ask that you re-schedule your appointment with our physicians should you arrive 10 or more minutes late for your appointment.  We strive to give you quality time with our providers, and arriving late affects you and other patients whose appointments are after yours.    Again, thank you for choosing [facility].  Our hope is that these requests will decrease the amount of time that you wait before being seen by our physicians.       _____________________________________________________________  Should you have questions after your visit to [facility], please contact our office at [phone] between the hours of 8:30 a.m. and 5:00 p.m.  Voicemails left after 4:30 p.m. will not be returned until the following business day.  For prescription refill requests, have your pharmacy contact our office with your prescription refill request.

## 2014-01-04 NOTE — Progress Notes (Signed)
Dominic Sandoval presented for labwork. Labs per MD order drawn via Peripheral Line 23 gauge needle inserted in rt ac Good blood return present. Procedure without incident.  Needle removed intact. Patient tolerated procedure well.

## 2014-01-06 LAB — MULTIPLE MYELOMA PANEL, SERUM
ALPHA-1-GLOBULIN: 5.1 % — AB (ref 2.9–4.9)
Albumin ELP: 52.8 % — ABNORMAL LOW (ref 55.8–66.1)
Alpha-2-Globulin: 11.9 % — ABNORMAL HIGH (ref 7.1–11.8)
Beta 2: 6.6 % — ABNORMAL HIGH (ref 3.2–6.5)
Beta Globulin: 5.4 % (ref 4.7–7.2)
Gamma Globulin: 18.2 % (ref 11.1–18.8)
IGA: 315 mg/dL (ref 68–379)
IGM, SERUM: 25 mg/dL — AB (ref 41–251)
IgG (Immunoglobin G), Serum: 1190 mg/dL (ref 650–1600)
M-Spike, %: 0.64 g/dL
TOTAL PROTEIN: 6.8 g/dL (ref 6.0–8.3)

## 2014-01-06 LAB — KAPPA/LAMBDA LIGHT CHAINS
Kappa free light chain: 8.11 mg/dL — ABNORMAL HIGH (ref 0.33–1.94)
Kappa, lambda light chain ratio: 4.16 — ABNORMAL HIGH (ref 0.26–1.65)
LAMDA FREE LIGHT CHAINS: 1.95 mg/dL (ref 0.57–2.63)

## 2014-07-07 ENCOUNTER — Other Ambulatory Visit (HOSPITAL_COMMUNITY): Payer: Medicare Other

## 2014-07-20 DEATH — deceased

## 2015-01-05 ENCOUNTER — Encounter (HOSPITAL_COMMUNITY): Payer: Self-pay | Admitting: Oncology

## 2015-01-05 ENCOUNTER — Ambulatory Visit (HOSPITAL_COMMUNITY): Payer: Self-pay | Admitting: Oncology

## 2015-01-05 ENCOUNTER — Other Ambulatory Visit (HOSPITAL_COMMUNITY): Payer: Medicare Other

## 2015-01-05 DIAGNOSIS — D696 Thrombocytopenia, unspecified: Secondary | ICD-10-CM | POA: Insufficient documentation

## 2015-01-05 DIAGNOSIS — N289 Disorder of kidney and ureter, unspecified: Secondary | ICD-10-CM

## 2015-01-05 HISTORY — DX: Disorder of kidney and ureter, unspecified: N28.9

## 2015-01-05 HISTORY — DX: Thrombocytopenia, unspecified: D69.6

## 2015-01-05 NOTE — Progress Notes (Signed)
-  Deceased-  Dominic Sandoval,[name]   

## 2015-01-05 NOTE — Assessment & Plan Note (Signed)
MGUS with an IgG kappa monoclonal protein. He had a few increased numbers of plasma cells in his bone marrow biopsy, but no bone lesions, and only a small M spike which has essentially remained stable. He continues to have no hypercalcemia. He has stable mild renal insufficiency. Total protein is within the normal range. He remains mildly anemic. White count and platelets are stable as well though he also remains mildly thrombocytopenic with no obvious cause by bone marrow biopsy etc. this may be related to one of his medications potentially.

## 2015-01-05 NOTE — Assessment & Plan Note (Signed)
H/O colon cancer with right hemicolectomy in 1990, no evidence of recurrence.

## 2015-01-05 NOTE — Assessment & Plan Note (Signed)
Normocytic anemia, anemia of chronic disease, stable.

## 2015-01-05 NOTE — Assessment & Plan Note (Signed)
Prostate cancer, treated with XRT in 2004.

## 2015-01-05 NOTE — Assessment & Plan Note (Signed)
Stable and likely contributing to anemia.

## 2015-01-05 NOTE — Assessment & Plan Note (Signed)
Stable

## 2015-01-05 NOTE — Assessment & Plan Note (Signed)
Bilateral renal lesions seen on CT of chest with growth of one of the lesions, unknown etiology, [place] of kidneys ordered and scheduled on 07/05/2013 and patient was a "no show" for that appt. He is not interested in further evaluation

## 2015-01-05 NOTE — Addendum Note (Signed)
Addended by: Baird Cancer on: 01/05/2015 09:34 AM   Modules accepted: Miquel Dunn
